# Patient Record
Sex: Male | Born: 1970 | Race: Black or African American | Hispanic: No | Marital: Married | Smoking: Current every day smoker
Health system: Southern US, Community
[De-identification: ages and names within clinical notes are randomized; demographics above are authoritative.]

## PROBLEM LIST (undated history)

## (undated) DIAGNOSIS — D649 Anemia, unspecified: Secondary | ICD-10-CM

## (undated) DIAGNOSIS — E7800 Pure hypercholesterolemia, unspecified: Secondary | ICD-10-CM

## (undated) HISTORY — DX: Anemia, unspecified: D64.9

## (undated) HISTORY — DX: Pure hypercholesterolemia, unspecified: E78.00

---

## 1997-10-29 ENCOUNTER — Emergency Department (HOSPITAL_COMMUNITY): Admission: EM | Admit: 1997-10-29 | Discharge: 1997-10-29 | Payer: Self-pay | Admitting: Emergency Medicine

## 1998-03-16 DIAGNOSIS — D649 Anemia, unspecified: Secondary | ICD-10-CM

## 1998-03-16 HISTORY — DX: Anemia, unspecified: D64.9

## 1998-09-09 ENCOUNTER — Emergency Department (HOSPITAL_COMMUNITY): Admission: EM | Admit: 1998-09-09 | Discharge: 1998-09-09 | Payer: Self-pay | Admitting: Emergency Medicine

## 2000-07-09 ENCOUNTER — Emergency Department (HOSPITAL_COMMUNITY): Admission: EM | Admit: 2000-07-09 | Discharge: 2000-07-09 | Payer: Self-pay | Admitting: *Deleted

## 2000-07-09 ENCOUNTER — Encounter: Payer: Self-pay | Admitting: Emergency Medicine

## 2002-01-17 ENCOUNTER — Emergency Department (HOSPITAL_COMMUNITY): Admission: EM | Admit: 2002-01-17 | Discharge: 2002-01-17 | Payer: Self-pay

## 2002-03-27 ENCOUNTER — Encounter: Admission: RE | Admit: 2002-03-27 | Discharge: 2002-03-27 | Payer: Self-pay | Admitting: Specialist

## 2002-03-27 ENCOUNTER — Encounter: Payer: Self-pay | Admitting: Specialist

## 2002-09-02 ENCOUNTER — Encounter: Payer: Self-pay | Admitting: Emergency Medicine

## 2002-09-02 ENCOUNTER — Emergency Department (HOSPITAL_COMMUNITY): Admission: EM | Admit: 2002-09-02 | Discharge: 2002-09-02 | Payer: Self-pay | Admitting: Emergency Medicine

## 2004-03-14 ENCOUNTER — Encounter: Admission: RE | Admit: 2004-03-14 | Discharge: 2004-03-14 | Payer: Self-pay | Admitting: Specialist

## 2006-01-13 IMAGING — CR DG CHEST 1V
1 series · 1 of 1 positions shown · non-contrast
Comparison: none

CLINICAL DATA: +PPD evaluation.
 PORTABLE CHEST RADIOGRAPH ? 03/14/04: 
 Comparing:    03/27/02.

[view not recorded]
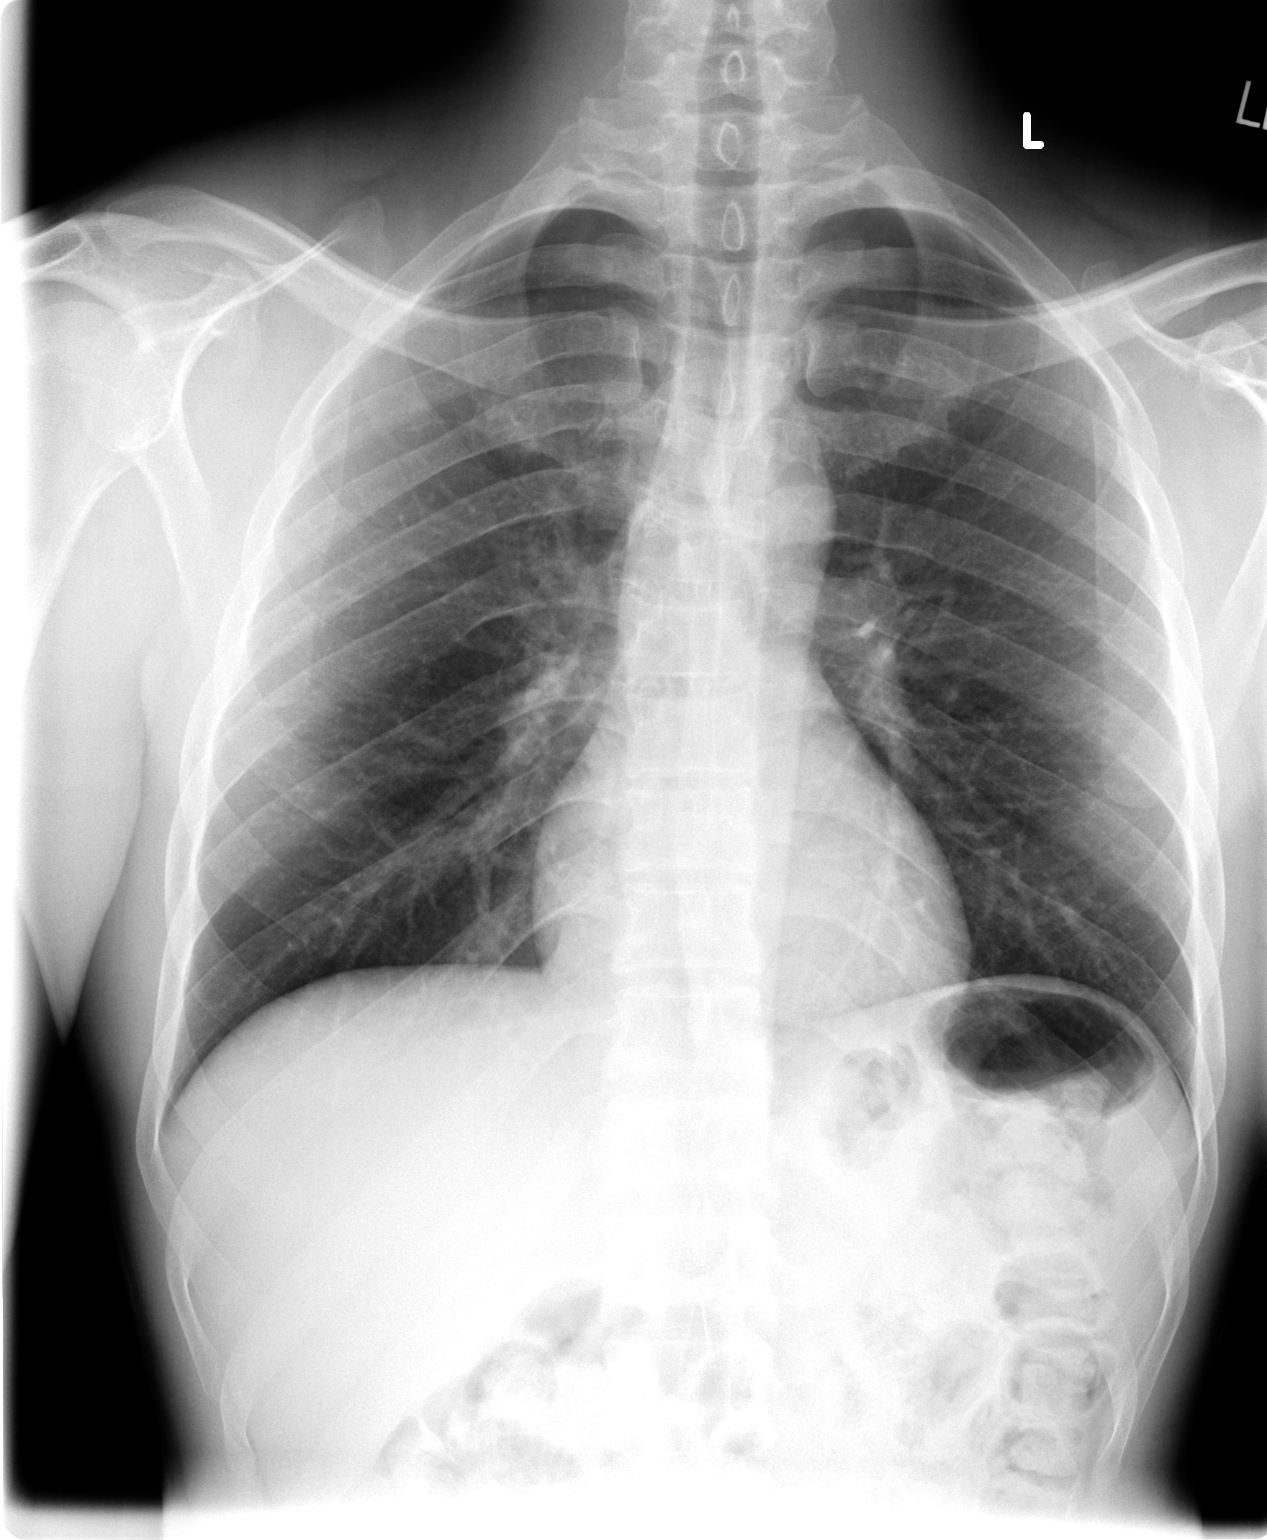

[1 of 1 positions shown; findings below may reference images not displayed]

FINDINGS: The heart size and mediastinal contours are normal.  The lungs are clear.  The visualized skeleton is unremarkable.
IMPRESSION: No active disease.   No evidence of active pulmonary tuberculosis.

## 2012-11-29 ENCOUNTER — Ambulatory Visit: Payer: Self-pay

## 2012-12-16 ENCOUNTER — Ambulatory Visit: Payer: Self-pay

## 2013-11-28 ENCOUNTER — Ambulatory Visit: Payer: Self-pay | Attending: Family Medicine | Admitting: Family Medicine

## 2013-11-28 ENCOUNTER — Encounter: Payer: Self-pay | Admitting: Family Medicine

## 2013-11-28 VITALS — BP 133/84 | HR 79 | Temp 98.3°F | Resp 18 | Ht 69.5 in | Wt 171.0 lb

## 2013-11-28 DIAGNOSIS — H6120 Impacted cerumen, unspecified ear: Secondary | ICD-10-CM | POA: Insufficient documentation

## 2013-11-28 DIAGNOSIS — Z833 Family history of diabetes mellitus: Secondary | ICD-10-CM | POA: Insufficient documentation

## 2013-11-28 DIAGNOSIS — H5200 Hypermetropia, unspecified eye: Secondary | ICD-10-CM

## 2013-11-28 DIAGNOSIS — H5213 Myopia, bilateral: Secondary | ICD-10-CM

## 2013-11-28 DIAGNOSIS — H6122 Impacted cerumen, left ear: Secondary | ICD-10-CM

## 2013-11-28 DIAGNOSIS — K089 Disorder of teeth and supporting structures, unspecified: Secondary | ICD-10-CM | POA: Insufficient documentation

## 2013-11-28 DIAGNOSIS — B353 Tinea pedis: Secondary | ICD-10-CM | POA: Insufficient documentation

## 2013-11-28 DIAGNOSIS — E7800 Pure hypercholesterolemia, unspecified: Secondary | ICD-10-CM | POA: Insufficient documentation

## 2013-11-28 DIAGNOSIS — H5203 Hypermetropia, bilateral: Secondary | ICD-10-CM

## 2013-11-28 DIAGNOSIS — F17200 Nicotine dependence, unspecified, uncomplicated: Secondary | ICD-10-CM | POA: Insufficient documentation

## 2013-11-28 DIAGNOSIS — D509 Iron deficiency anemia, unspecified: Secondary | ICD-10-CM | POA: Insufficient documentation

## 2013-11-28 DIAGNOSIS — H5210 Myopia, unspecified eye: Secondary | ICD-10-CM

## 2013-11-28 LAB — CBC
HEMATOCRIT: 39.8 % (ref 39.0–52.0)
HEMOGLOBIN: 14.3 g/dL (ref 13.0–17.0)
MCH: 28.3 pg (ref 26.0–34.0)
MCHC: 35.9 g/dL (ref 30.0–36.0)
MCV: 78.7 fL (ref 78.0–100.0)
Platelets: 248 10*3/uL (ref 150–400)
RBC: 5.06 MIL/uL (ref 4.22–5.81)
RDW: 13.5 % (ref 11.5–15.5)
WBC: 7.8 10*3/uL (ref 4.0–10.5)

## 2013-11-28 MED ORDER — KETOCONAZOLE 2 % EX CREA: 1.0000 "application " | TOPICAL_CREAM | Freq: Every day | CUTANEOUS | Status: DC

## 2013-11-28 NOTE — Progress Notes (Signed)
   Subjective:    Patient ID: Jerry Jensen, male    DOB: 05-17-1970, 43 y.o.   MRN: [id number] CC: establish care, health maintenance physical  HPI A 43 year old male presents to establish care and for a health maintenance physical:  #1 complaints/concerns: none   Social history: current smoker desires to quit  Review of Systems General:  Negative for nexplained weight loss, fever Skin: Negative for new or changing mole, sore that won't heal HEENT: positive for trouble seeing up close. Negative for trouble hearing,  ringing in ears, mouth sores, hoarseness, change in voice, dysphagia. CV:  Negative for chest pain, dyspnea, edema, palpitations Resp: Negative for cough, dyspnea, hemoptysis GI: Negative for nausea, vomiting, diarrhea, constipation, abdominal pain, melena, hematochezia. GU: Negative for dysuria, incontinence, urinary hesitance, hematuria, vaginal or penile discharge, polyuria, sexual difficulty, lumps in testicle or breasts MSK: Negative for muscle cramps or aches, joint pain or swelling Neuro: Negative for headaches, weakness, numbness, dizziness, passing out/fainting Psych: Negative for depression, anxiety, memory problems    Objective:   Physical Exam BP 133/84  Pulse 79  Temp(Src) 98.3 F (36.8 C) (Oral)  Resp 18  Ht 5' 9.5" (1.765 m)  Wt 171 lb (77.565 kg)  BMI 24.90 kg/m2  SpO2 98%  General Appearance:    Alert, cooperative, no distress, appears stated age  Head:    Normocephalic, without obvious abnormality, atraumatic  Eyes:    PERRL, conjunctiva/corneas clear, EOM's intact, fundi    benign, both eyes       Ears:    Normal TM's and external ear canals, both ears  Nose:   Nares normal, septum midline, mucosa normal, no drainage   or sinus tenderness  Throat:   Lips, mucosa, and tongue normal; teeth - poor dentition.  gums normal  Neck:   Supple, symmetrical, trachea midline, no adenopathy;       thyroid:  No enlargement/tenderness/nodules;   Back:      Symmetric, no curvature, ROM normal, no CVA tenderness  Lungs:     Clear to auscultation bilaterally, respirations unlabored  Chest wall:    No tenderness or deformity  Heart:    Regular rate and rhythm, S1 and S2 normal, no murmur, rub   or gallop  Abdomen:     Soft, non-tender, bowel sounds active all four quadrants,    no masses, no organomegaly  Genitalia:    Normal male, circumcised  without lesion, discharge or tenderness  Rectal:    Deferred   Extremities:   Extremities normal, atraumatic, no cyanosis or edema  Pulses:   2+ and symmetric all extremities  Skin:   Skin color, texture, turgor normal. White macerated skin between toes on L foot.   Lymph nodes:   Cervical, supraclavicular, and axillary nodes normal  Neurologic:   CNII-XII intact. Normal strength, sensation and reflexes      throughout       Assessment & Plan:

## 2013-11-28 NOTE — Assessment & Plan Note (Signed)
A: fam history of DM2 in a smoker, may be in statin benefit group P: CMP Lipid panel

## 2013-11-28 NOTE — Assessment & Plan Note (Signed)
Ear irrigated today.  

## 2013-11-28 NOTE — Assessment & Plan Note (Signed)
A: mild acute tinea pedis, L foot P: nizoral cream

## 2013-11-28 NOTE — Progress Notes (Signed)
Establish Care Annual Physicol   

## 2013-11-28 NOTE — Assessment & Plan Note (Signed)
A: history of IDA.  P: CBC, iron studies.

## 2013-11-28 NOTE — Patient Instructions (Addendum)
Jerry Jensen,  Thank you for coming in today. It was a pleasure meeting you. I look forward to be a primary doctor.  Your physical is normal except for the ear wax that was irrigated today.   You will be called with lab results.   Smoking cessation support: smoking cessation hotline: 1-800-QUIT-NOW.  Smoking cessation classes are available through [facility]. Call [phone] or visit our website at [email or web address]. I also recommend nicotine patches 21 mg for two weeks, 14 mg for 6 weeks, then 7 mg for 4 weeks. To help with smoking cessation.     Dr. Armen Pickup

## 2013-11-28 NOTE — Assessment & Plan Note (Signed)
A: current smoker, desires to quit P: Smoking cessation resources given Recommend nicotine patches

## 2013-11-28 NOTE — Assessment & Plan Note (Signed)
A: poor dentition in a smoker P: dental referral  Smoking cessation

## 2013-11-29 LAB — IRON AND TIBC
%SAT: 24 % (ref 20–55)
IRON: 81 ug/dL (ref 42–165)
TIBC: 334 ug/dL (ref 215–435)
UIBC: 253 ug/dL (ref 125–400)

## 2013-11-29 LAB — LIPID PANEL
CHOL/HDL RATIO: 5 ratio
Cholesterol: 225 mg/dL — ABNORMAL HIGH (ref 0–200)
HDL: 45 mg/dL (ref >39)
LDL Cholesterol: 125 mg/dL — ABNORMAL HIGH (ref 0–99)
TRIGLYCERIDES: 276 mg/dL — AB (ref <150)
VLDL: 55 mg/dL — ABNORMAL HIGH (ref 0–40)

## 2013-11-29 LAB — COMPLETE METABOLIC PANEL WITH GFR
ALK PHOS: 70 U/L (ref 39–117)
ALT: 29 U/L (ref 0–53)
AST: 22 U/L (ref 0–37)
Albumin: 4.6 g/dL (ref 3.5–5.2)
BUN: 8 mg/dL (ref 6–23)
CO2: 27 meq/L (ref 19–32)
CREATININE: 0.82 mg/dL (ref 0.50–1.35)
Calcium: 9.6 mg/dL (ref 8.4–10.5)
Chloride: 105 mEq/L (ref 96–112)
GFR, Est Non African American: >89 mL/min
Glucose, Bld: 91 mg/dL (ref 70–99)
Potassium: 4.3 mEq/L (ref 3.5–5.3)
Sodium: 138 mEq/L (ref 135–145)
Total Bilirubin: 0.4 mg/dL (ref 0.2–1.2)
Total Protein: 7.5 g/dL (ref 6.0–8.3)

## 2013-11-29 LAB — FERRITIN: Ferritin: 275 ng/mL (ref 22–322)

## 2013-12-01 DIAGNOSIS — E7800 Pure hypercholesterolemia, unspecified: Secondary | ICD-10-CM

## 2013-12-01 HISTORY — DX: Pure hypercholesterolemia, unspecified: E78.00

## 2013-12-01 MED ORDER — ATORVASTATIN CALCIUM 20 MG PO TABS: 20.0000 mg | ORAL_TABLET | Freq: Every day | ORAL | Status: DC

## 2013-12-01 NOTE — Assessment & Plan Note (Signed)
Elevated cholesterol- plan start lipitor 20 mg daily,  regular exercise, low fat, low sugar diet, quit smoking- all in order to lower risk of heart disease and stroke.  Will repeat fasting lipids in 6-12 months.

## 2013-12-01 NOTE — Addendum Note (Signed)
Addended by: Dessa Phi on: 12/01/2013 10:15 AM   Modules accepted: Orders

## 2013-12-04 ENCOUNTER — Telehealth: Payer: Self-pay | Admitting: *Deleted

## 2013-12-04 NOTE — Telephone Encounter (Signed)
Message copied by Dyann Kief on Mon Dec 04, 2013  2:14 PM ------      Message from: Dessa Phi      Created: Fri Dec 01, 2013 10:14 AM       normal CBC, iron studies, CMP      Elevated cholesterol- plan start lipitor 20 mg daily,  regular exercise, low fat, low sugar diet, quit smoking- all in order to lower risk of heart disease and stroke.       Will repeat fasting lipids in 6-12 months.        ------

## 2013-12-04 NOTE — Telephone Encounter (Signed)
Unable to cantact Pt, both number listed are wrong number

## 2014-01-15 ENCOUNTER — Telehealth: Payer: Self-pay | Admitting: Family Medicine

## 2014-01-15 NOTE — Telephone Encounter (Signed)
Patient is calling for most recent lab results; please f/u with patient

## 2014-01-16 ENCOUNTER — Telehealth: Payer: Self-pay | Admitting: Emergency Medicine

## 2014-01-16 NOTE — Telephone Encounter (Signed)
Left message for pt to call for lab results 

## 2014-01-24 ENCOUNTER — Telehealth: Payer: Self-pay | Admitting: Family Medicine

## 2014-01-24 NOTE — Telephone Encounter (Signed)
Pt. Came into facility requesting results. Please f/u with pt.

## 2014-01-26 ENCOUNTER — Telehealth: Payer: Self-pay | Admitting: Emergency Medicine

## 2014-01-26 NOTE — Telephone Encounter (Signed)
Pt given lab results with instructions to start taking prescribed Lipitor 20 mg tab daily followed with diet/exercise [facility] Pt instructed to work on smoking cessation with low fat diet control

## 2014-04-27 ENCOUNTER — Ambulatory Visit: Payer: Self-pay | Admitting: Family Medicine

## 2014-05-03 ENCOUNTER — Encounter: Payer: Self-pay | Admitting: Family Medicine

## 2014-05-03 ENCOUNTER — Ambulatory Visit: Payer: Self-pay | Attending: Family Medicine | Admitting: Family Medicine

## 2014-05-03 VITALS — BP 109/71 | HR 76 | Temp 98.0°F | Resp 16 | Ht 71.0 in | Wt 182.6 lb

## 2014-05-03 DIAGNOSIS — Z23 Encounter for immunization: Secondary | ICD-10-CM

## 2014-05-03 DIAGNOSIS — F17210 Nicotine dependence, cigarettes, uncomplicated: Secondary | ICD-10-CM | POA: Insufficient documentation

## 2014-05-03 DIAGNOSIS — K089 Disorder of teeth and supporting structures, unspecified: Secondary | ICD-10-CM

## 2014-05-03 DIAGNOSIS — Z720 Tobacco use: Secondary | ICD-10-CM

## 2014-05-03 DIAGNOSIS — E7800 Pure hypercholesterolemia, unspecified: Secondary | ICD-10-CM

## 2014-05-03 DIAGNOSIS — K088 Other specified disorders of teeth and supporting structures: Secondary | ICD-10-CM

## 2014-05-03 DIAGNOSIS — N529 Male erectile dysfunction, unspecified: Secondary | ICD-10-CM

## 2014-05-03 DIAGNOSIS — F17200 Nicotine dependence, unspecified, uncomplicated: Secondary | ICD-10-CM

## 2014-05-03 LAB — LIPID PANEL
CHOL/HDL RATIO: 4.1 ratio
CHOLESTEROL: 176 mg/dL (ref 0–200)
HDL: 43 mg/dL (ref >39)
LDL CALC: 95 mg/dL (ref 0–99)
TRIGLYCERIDES: 191 mg/dL — AB (ref <150)
VLDL: 38 mg/dL (ref 0–40)

## 2014-05-03 MED ORDER — VARENICLINE TARTRATE 0.5 MG X 11 & 1 MG X 42 PO MISC: ORAL | Status: DC

## 2014-05-03 MED ORDER — SILDENAFIL CITRATE 100 MG PO TABS: 50.0000 mg | ORAL_TABLET | Freq: Every day | ORAL | Status: DC | PRN

## 2014-05-03 MED ORDER — VARENICLINE TARTRATE 1 MG PO TABS: 1.0000 mg | ORAL_TABLET | Freq: Two times a day (BID) | ORAL | Status: DC

## 2014-05-03 NOTE — Progress Notes (Signed)
   Subjective:    Patient ID: Jerry MeekSouleymane Fier, male    DOB: 07/26/1970, 44 y.o.   MRN: [id number] CC: recheck cholesterol, ED, dental referral.  HPI 44 yo M with hyperlipidemia  1. Erectile Dysfunction Patient complains of erectile dysfunction. Onset of dysfunction was 5 months ago and was gradual in onset.  Patient states the nature of difficulty is both attaining and maintaining erection. Full erections occur with intercourse and with masturbation. Partial erections occur never. Libido is not affected. Risk factors for ED include: none. Patient denies history of cardiovascular disease, diabetes mellitus, cranial, spinal, or pelvic trauma, pelvic radiation, beta blocker use and hypogonadism. Patient's expectations as to sexual function to get a spontaneous erection.  Patient's description of relationship with partner good with wife but she has expressed some frustration with the amount of time and energy required to obtain erection. Previous treatment of ED includes cialis x one dose 10 years ago, he had headache.  2. Smoker: taking lozenges: has cut down smoking quite a bit.   3. dental referral: needs a cleaning. Intermittent R lower molar pain w/o swelling.   Soc Hx: < 7-8 cigs per day  No ETOH.  Review of Systems     Objective:   Physical Exam BP 109/71 mmHg  Pulse 76  Temp(Src) 98 F (36.7 C)  Resp 16  Ht 5\' 11"  (1.803 m)  Wt 182 lb 9.6 oz (82.827 kg)  BMI 25.48 kg/m2  SpO2 97% General appearance: alert, cooperative and no distress Lungs: clear to auscultation bilaterally Heart: regular rate and rhythm, S1, S2 normal, no murmur, click, rub or gallop       Assessment & Plan:

## 2014-05-03 NOTE — Assessment & Plan Note (Signed)
Dental referral placed unsure if patient has orange card

## 2014-05-03 NOTE — Progress Notes (Signed)
Patient here to follow up on his cholesterol  Patient is complaining of erectile dysfunction for last 4 -5 months Patient will take flu shot

## 2014-05-03 NOTE — Assessment & Plan Note (Signed)
1. High cholesterol:  Rechecking lipid panel today

## 2014-05-03 NOTE — Assessment & Plan Note (Signed)
Erectile dysfunction: Start viagra Quit smoking

## 2014-05-03 NOTE — Patient Instructions (Signed)
Mr. Pernell DupreDjibo,  1. High cholesterol:  Rechecking lipid panel today  2. Erectile dysfunction: Start viagra Quit smoking   3. Smoking: Smoking cessation support: smoking cessation hotline: 1-800-QUIT-NOW.  Smoking cessation classes are available through [facility]. Call [phone] or visit our website at [email or web address]. Chantix: Take one 0.5 mg tablet by mouth once daily for 3 days, then increase to one 0.5 mg tablet twice daily for 4 days, then increase to one 1 mg tablet twice daily.  4. Dental referral [placed  You will be called with lab results  F/u in 3 months for smoking and erectile dysfunction sooner if needed  Dr. Armen PickupFunches

## 2014-05-03 NOTE — Assessment & Plan Note (Signed)
Smoking: desires to quit  Smoking cessation support: smoking cessation hotline: 1-800-QUIT-NOW.  Smoking cessation classes are available through [facility]. Call [phone] or visit our website at [email or web address]. Chantix: Take one 0.5 mg tablet by mouth once daily for 3 days, then increase to one 0.5 mg tablet twice daily for 4 days, then increase to one 1 mg tablet twice daily.

## 2014-05-06 NOTE — Addendum Note (Signed)
Addended by: Dessa PhiFUNCHES, Alexx Giambra on: 05/06/2014 09:52 AM   Modules accepted: Orders

## 2014-05-09 ENCOUNTER — Telehealth: Payer: Self-pay | Admitting: *Deleted

## 2014-05-09 NOTE — Telephone Encounter (Signed)
Pt aware of results Stated trying to stop smocking

## 2014-05-09 NOTE — Telephone Encounter (Signed)
-----   Message from Lora PaulaJosalyn C Funches, MD sent at 05/06/2014  9:51 AM EST ----- Improved lipid panel Patient may stop statin therapy. Work on quitting smoking to lower risk of heart disease in stroke  Aside: 10 yr CVD risk 4.0%

## 2014-11-16 ENCOUNTER — Ambulatory Visit: Payer: Self-pay | Admitting: Family Medicine

## 2014-11-23 ENCOUNTER — Emergency Department (INDEPENDENT_AMBULATORY_CARE_PROVIDER_SITE_OTHER)
Admission: EM | Admit: 2014-11-23 | Discharge: 2014-11-23 | Disposition: A | Discharge status: Home / Self Care | Payer: No Typology Code available for payment source | Source: Home / Self Care | Attending: Emergency Medicine | Admitting: Emergency Medicine

## 2014-11-23 ENCOUNTER — Encounter (HOSPITAL_COMMUNITY): Payer: Self-pay | Admitting: *Deleted

## 2014-11-23 DIAGNOSIS — L02416 Cutaneous abscess of left lower limb: Secondary | ICD-10-CM

## 2014-11-23 MED ORDER — MUPIROCIN 2 % EX OINT: 1.0000 "application " | TOPICAL_OINTMENT | Freq: Three times a day (TID) | CUTANEOUS | Status: DC

## 2014-11-23 NOTE — Discharge Instructions (Signed)
Abscess Care After. Your abscess appears to be resolving. Use warm moist compresses at least 4 times a day.  This is contagious. Wash hands and clean area of leg with alcohol. An abscess (also called a boil or furuncle) is an infected area that contains a collection of pus. Signs and symptoms of an abscess include pain, tenderness, redness, or hardness, or you may feel a moveable soft area under your skin. An abscess can occur anywhere in the body. The infection may spread to surrounding tissues causing cellulitis. A cut (incision) by the surgeon was made over your abscess and the pus was drained out. Gauze may have been packed into the space to provide a drain that will allow the cavity to heal from the inside outwards. The boil may be painful for 5 to 7 days. Most people with a boil do not have high fevers. Your abscess, if seen early, may not have localized, and may not have been lanced. If not, another appointment may be required for this if it does not get better on its own or with medications. HOME CARE INSTRUCTIONS   Only take over-the-counter or prescription medicines for pain, discomfort, or fever as directed by your caregiver.  When you bathe, soak and then remove gauze or iodoform packs at least daily or as directed by your caregiver. You may then wash the wound gently with mild soapy water. Repack with gauze or do as your caregiver directs. SEEK IMMEDIATE MEDICAL CARE IF:   You develop increased pain, swelling, redness, drainage, or bleeding in the wound site.  You develop signs of generalized infection including muscle aches, chills, fever, or a general ill feeling.  An oral temperature above 102 F (38.9 C) develops, not controlled by medication. See your caregiver for a recheck if you develop any of the symptoms described above. If medications (antibiotics) were prescribed, take them as directed. Document Released: 09/18/2004 Document Revised: 05/25/2011 Document Reviewed:  05/16/2007 [facility] Patient Information 2015 [place], [place]. This information is not intended to replace advice given to you by your health care provider. Make sure you discuss any questions you have with your health care provider.

## 2014-11-23 NOTE — ED Notes (Signed)
Pt  Reports   He   Noticed      A         Boil  On  His  l  Inner  Thigh        For 2-3  Weeks        He  Reports  The  Boil  Started  Draining        And     Today  It  Is    Tender to the  Touch

## 2014-11-23 NOTE — ED Provider Notes (Signed)
CSN: [id number]     Arrival date & time 11/23/14  1329 History   First MD Initiated Contact with Patient 11/23/14 1404     Chief Complaint  Patient presents with  . Abscess   (Consider location/radiation/quality/duration/timing/severity/associated sxs/prior Treatment) HPI Comments: 44 year old male patient reports to the provider that he has had a tender abscess-like structure to the left proximal inner thigh for placement one week. Today it ruptured and has quickly become smaller. There is tenderness and induration in an area approximately 2-1/2 cm diameter. This area also has an overlying area in which the dermis appears to have been a braised but was wants a fistula for spontaneous drainage. No surrounding erythema. No lymphangitis.   Past Medical History  Diagnosis Date  . Anemia 2000   No past surgical history on file. Family History  Problem Relation Age of Onset  . Cancer Mother     breast   . Diabetes Sister   . Hypertension Sister   . Anemia Sister   . Heart disease Neg Hx    Social History  Substance Use Topics  . Smoking status: Current Every Day Smoker -- 0.50 packs/day for 30 years    Types: Cigarettes  . Smokeless tobacco: Never Used  . Alcohol Use: No    Review of Systems  Constitutional: Negative.   Respiratory: Negative.   Musculoskeletal: Negative.   Skin: Negative for rash.       As per history of present illness  Neurological: Negative.   All other systems reviewed and are negative.   Allergies  Review of patient's allergies indicates no known allergies.  Home Medications   Prior to Admission medications   Medication Sig Start Date End Date Taking? Authorizing Provider  ferrous sulfate 325 (65 FE) MG tablet Take 325 mg by mouth 2 (two) times daily with a meal.    Historical Provider, MD  ketoconazole (NIZORAL) 2 % cream Apply 1 application topically daily. 11/28/13   Dessa Phi, MD  mupirocin ointment (BACTROBAN) 2 % Apply 1 application  topically 3 (three) times daily. Apply after warm soak for 10 minutes 11/23/14   Hayden Rasmussen, NP  sildenafil (VIAGRA) 100 MG tablet Take 0.5-1 tablets (50-100 mg total) by mouth daily as needed for erectile dysfunction. 05/03/14   Dessa Phi, MD  varenicline (CHANTIX CONTINUING MONTH PAK) 1 MG tablet Take 1 tablet (1 mg total) by mouth 2 (two) times daily. 05/03/14   Josalyn Funches, MD  varenicline (CHANTIX STARTING MONTH PAK) 0.5 MG X 11 & 1 MG X 42 tablet Take as instructed 05/03/14   Dessa Phi, MD   Meds Ordered and Administered this Visit  Medications - No data to display  BP 141/90 mmHg  Pulse 78  Temp(Src) 98.6 F (37 C) (Oral)  Resp 16  SpO2 96% No data found.   Physical Exam  Constitutional: He is oriented to person, place, and time. He appears well-developed and well-nourished. No distress.  Neck: Normal range of motion. Neck supple.  Pulmonary/Chest: Effort normal. No respiratory distress.  Musculoskeletal: Normal range of motion. He exhibits no edema.  Neurological: He is alert and oriented to person, place, and time. He exhibits normal muscle tone.  Skin: Skin is warm and dry.  See history of present illness. No current drainage. Area of superficial induration and skin change appearance similar to an abrasion approximately 2-1/2 cm. No erythema or lymphangitis.  Psychiatric: He has a normal mood and affect.  Nursing note and vitals reviewed.   ED  Course  Procedures (including critical care time)  Labs Review Labs Reviewed - No data to display  Imaging Review No results found.   Visual Acuity Review  Right Eye Distance:   Left Eye Distance:   Bilateral Distance:    Right Eye Near:   Left Eye Near:    Bilateral Near:         MDM   1. Abscess of left thigh   Resolving. No longer amenable to I and D at this time. Mupirocin oint as directed. Care After. Your abscess appears to be resolving. Use warm moist compresses at least 4 times a day.  This  is contagious. Wash hands and clean area of leg with alcohol.     Hayden Rasmussen, NP 11/23/14 [phone]

## 2014-12-06 ENCOUNTER — Encounter: Payer: Self-pay | Admitting: Family Medicine

## 2014-12-06 ENCOUNTER — Ambulatory Visit: Payer: No Typology Code available for payment source | Attending: Family Medicine | Admitting: Family Medicine

## 2014-12-06 VITALS — BP 132/87 | HR 73 | Temp 97.7°F | Resp 16 | Ht 71.0 in | Wt 171.0 lb

## 2014-12-06 DIAGNOSIS — Z0000 Encounter for general adult medical examination without abnormal findings: Secondary | ICD-10-CM

## 2014-12-06 DIAGNOSIS — Z23 Encounter for immunization: Secondary | ICD-10-CM

## 2014-12-06 DIAGNOSIS — J00 Acute nasopharyngitis [common cold]: Secondary | ICD-10-CM

## 2014-12-06 DIAGNOSIS — N529 Male erectile dysfunction, unspecified: Secondary | ICD-10-CM

## 2014-12-06 DIAGNOSIS — F17200 Nicotine dependence, unspecified, uncomplicated: Secondary | ICD-10-CM

## 2014-12-06 DIAGNOSIS — Z720 Tobacco use: Secondary | ICD-10-CM

## 2014-12-06 MED ORDER — CETIRIZINE HCL 10 MG PO TABS: 10.0000 mg | ORAL_TABLET | Freq: Every day | ORAL | Status: DC

## 2014-12-06 MED ORDER — SALINE SPRAY 0.65 % NA SOLN: 1.0000 | NASAL | Status: DC | PRN

## 2014-12-06 MED ORDER — FLUTICASONE PROPIONATE 50 MCG/ACT NA SUSP: 2.0000 | Freq: Every day | NASAL | Status: DC

## 2014-12-06 MED ORDER — SILDENAFIL CITRATE 100 MG PO TABS: 50.0000 mg | ORAL_TABLET | Freq: Every day | ORAL | Status: DC | PRN

## 2014-12-06 MED ORDER — VARENICLINE TARTRATE 0.5 MG X 11 & 1 MG X 42 PO MISC: ORAL | Status: DC

## 2014-12-06 MED ORDER — VARENICLINE TARTRATE 1 MG PO TABS: 1.0000 mg | ORAL_TABLET | Freq: Two times a day (BID) | ORAL | Status: DC

## 2014-12-06 NOTE — Assessment & Plan Note (Signed)
A:  Smoker. Desires to quit.  P: Start  chantix is too expensive get nicotine patch. Remember that if you develop depression or thoughts of suicide while on chantix, stop it and call for follow up.   21 mg patch for 6 weeks 14 mg patch for 2 weeks 7 mg patch for 2 weeks

## 2014-12-06 NOTE — Patient Instructions (Signed)
Mr. Jerry Jensen,  Thank you for coming in today  Jerry Jensen was seen today for sinusitis and mass.  Diagnoses and all orders for this visit:  Acute rhinitis -     fluticasone (FLONASE) 50 MCG/ACT nasal spray; Place 2 sprays into both nostrils daily. -     sodium chloride (OCEAN) 0.65 % SOLN nasal spray; Place 1 spray into both nostrils as needed for congestion. -     cetirizine (ZYRTEC) 10 MG tablet; Take 1 tablet (10 mg total) by mouth daily.  Erectile dysfunction, unspecified erectile dysfunction type -     sildenafil (VIAGRA) 100 MG tablet; Take 0.5-1 tablets (50-100 mg total) by mouth daily as needed for erectile dysfunction.  Smoker -     varenicline (CHANTIX CONTINUING MONTH PAK) 1 MG tablet; Take 1 tablet (1 mg total) by mouth 2 (two) times daily. -     varenicline (CHANTIX STARTING MONTH PAK) 0.5 MG X 11 & 1 MG X 42 tablet; Take as instructed  if chantix is too expensive get nicotine patch. Remember that if you develop depression or thoughts of suicide while on chantix, stop it and call for follow up.   21 mg patch for 6 weeks 14 mg patch for 2 weeks 7 mg patch for 2 weeks  F/u with me in 2 months for smoking cessation  Dr. Armen Pickup   Dr. Armen Pickup

## 2014-12-06 NOTE — Progress Notes (Signed)
Patient ID: Jerry Jensen, male   DOB: December 29, 1970, 44 y.o.   MRN: [id number]   Subjective:  Patient ID: Jerry Jensen, male    DOB: Jun 15, 1970  Age: 44 y.o. MRN: [id number]  CC: Sinusitis and Mass  HPI Jerry Jensen presents for bump on L posterior thigh, sinus pressure  1. L thigh bump: has drained. Was tender. Improving. No redness. No continual drainage.   2. Sinus pressure: x one night. No history of allergies. No fever or chills.   3. Smoking: desires to quit. Has not obtained chantix. Not in pharmacy. Smoking 1 PPD. Has withdrawal symptoms when he tried to cut down on his own.   Outpatient Prescriptions Prior to Visit  Medication Sig Dispense Refill  . ferrous sulfate 325 (65 FE) MG tablet Take 325 mg by mouth 2 (two) times daily with a meal.    . ketoconazole (NIZORAL) 2 % cream Apply 1 application topically daily. (Patient not taking: Reported on 12/06/2014) 15 g 0  . mupirocin ointment (BACTROBAN) 2 % Apply 1 application topically 3 (three) times daily. Apply after warm soak for 10 minutes (Patient not taking: Reported on 12/06/2014) 22 g 0  . sildenafil (VIAGRA) 100 MG tablet Take 0.5-1 tablets (50-100 mg total) by mouth daily as needed for erectile dysfunction. (Patient not taking: Reported on 12/06/2014) 5 tablet 3  . varenicline (CHANTIX CONTINUING MONTH PAK) 1 MG tablet Take 1 tablet (1 mg total) by mouth 2 (two) times daily. (Patient not taking: Reported on 12/06/2014) 60 tablet 1  . varenicline (CHANTIX STARTING MONTH PAK) 0.5 MG X 11 & 1 MG X 42 tablet Take as instructed (Patient not taking: Reported on 12/06/2014) 53 tablet 0   No facility-administered medications prior to visit.   Social History  Substance Use Topics  . Smoking status: Current Every Day Smoker -- 0.50 packs/day for 30 years    Types: Cigarettes  . Smokeless tobacco: Never Used  . Alcohol Use: No   ROS Review of Systems  Constitutional: Negative for fever, chills, fatigue and unexpected weight  change.  HENT: Positive for congestion and sinus pressure. Negative for ear discharge, ear pain and facial swelling.   Eyes: Negative for visual disturbance.  Respiratory: Negative for cough and shortness of breath.   Cardiovascular: Negative for chest pain, palpitations and leg swelling.  Gastrointestinal: Negative for nausea, vomiting, abdominal pain, diarrhea, constipation and blood in stool.  Endocrine: Negative for polydipsia, polyphagia and polyuria.  Musculoskeletal: Negative for myalgias, back pain, arthralgias, gait problem and neck pain.  Skin: Negative for rash.  Allergic/Immunologic: Negative for immunocompromised state.  Hematological: Negative for adenopathy. Does not bruise/bleed easily.  Psychiatric/Behavioral: Negative for suicidal ideas, sleep disturbance and dysphoric mood. The patient is not nervous/anxious.     Objective:  BP 132/87 mmHg  Pulse 73  Temp(Src) 97.7 F (36.5 C) (Other (Comment))  Resp 16  Ht  (1.803 m)  Wt 171 lb (77.565 kg)  BMI 23.86 kg/m2  SpO2 98%  BP/Weight 12/06/2014 11/23/2014 05/03/2014  Systolic BP 132 141 109  Diastolic BP 87 90 71  Wt. (Lbs) 171 - 182.6  BMI 23.86 - 25.48   Physical Exam  Constitutional: He appears well-developed and well-nourished. No distress.  HENT:  Head: Normocephalic and atraumatic.  Nose: Mucosal edema present.  Mouth/Throat: Oropharynx is clear and moist.  Eyes: Conjunctivae are normal. Pupils are equal, round, and reactive to light. Right eye exhibits no discharge.  Neck: Normal range of motion. Neck supple.  Cardiovascular: Normal  rate, regular rhythm, normal heart sounds and intact distal pulses.   Pulmonary/Chest: Effort normal and breath sounds normal.  Musculoskeletal: He exhibits no edema.  Neurological: He is alert.  Skin: Skin is warm and dry. No rash noted. No erythema.  Psychiatric: He has a normal mood and affect.  GAD-7: score of 0    Assessment & Plan:   Problem List Items  Addressed This Visit    Acute rhinitis - Primary   Relevant Medications   fluticasone (FLONASE) 50 MCG/ACT nasal spray   sodium chloride (OCEAN) 0.65 % SOLN nasal spray   cetirizine (ZYRTEC) 10 MG tablet   Erectile dysfunction (Chronic)   Relevant Medications   sildenafil (VIAGRA) 100 MG tablet   Smoker (Chronic)    A:  Smoker. Desires to quit.  P: Start  chantix is too expensive get nicotine patch. Remember that if you develop depression or thoughts of suicide while on chantix, stop it and call for follow up.   21 mg patch for 6 weeks 14 mg patch for 2 weeks 7 mg patch for 2 weeks       Relevant Medications   varenicline (CHANTIX CONTINUING MONTH PAK) 1 MG tablet   varenicline (CHANTIX STARTING MONTH PAK) 0.5 MG X 11 & 1 MG X 42 tablet    Other Visit Diagnoses    Healthcare maintenance        Relevant Orders    Flu Vaccine QUAD 36+ mos IM       No orders of the defined types were placed in this encounter.    Follow-up: No Follow-up on file.   Dessa Phi MD

## 2014-12-06 NOTE — Progress Notes (Signed)
C/C bump on leg and bottom  ED F/U skin infection  Possible sinuses infection  No pain Hx tobacco -1ppday

## 2016-02-19 ENCOUNTER — Ambulatory Visit: Payer: Self-pay | Attending: Family Medicine

## 2016-05-01 ENCOUNTER — Encounter: Payer: Self-pay | Admitting: Family Medicine

## 2016-05-15 ENCOUNTER — Encounter: Payer: Self-pay | Admitting: Family Medicine

## 2016-05-15 ENCOUNTER — Ambulatory Visit: Payer: Self-pay | Attending: Family Medicine | Admitting: Family Medicine

## 2016-05-15 VITALS — BP 138/87 | HR 84 | Temp 98.2°F | Ht 71.0 in | Wt 169.2 lb

## 2016-05-15 DIAGNOSIS — F17210 Nicotine dependence, cigarettes, uncomplicated: Secondary | ICD-10-CM | POA: Insufficient documentation

## 2016-05-15 DIAGNOSIS — E7800 Pure hypercholesterolemia, unspecified: Secondary | ICD-10-CM | POA: Insufficient documentation

## 2016-05-15 DIAGNOSIS — Z0000 Encounter for general adult medical examination without abnormal findings: Secondary | ICD-10-CM | POA: Insufficient documentation

## 2016-05-15 DIAGNOSIS — N529 Male erectile dysfunction, unspecified: Secondary | ICD-10-CM | POA: Insufficient documentation

## 2016-05-15 DIAGNOSIS — D509 Iron deficiency anemia, unspecified: Secondary | ICD-10-CM | POA: Insufficient documentation

## 2016-05-15 MED ORDER — SILDENAFIL CITRATE 25 MG PO TABS: 25.0000 mg | ORAL_TABLET | Freq: Every day | ORAL | 0 refills | Status: DC | PRN

## 2016-05-15 MED FILL — !VIAGRA 25 MG TABLET: 25 | 3 days supply | Qty: 3 | Fill #0

## 2016-05-15 NOTE — Patient Instructions (Addendum)
Diagnoses and all orders for this visit:  Iron deficiency anemia, unspecified iron deficiency anemia type -     Cancel: CBC -     Cancel: Iron and TIBC -     Cancel: Ferritin -     CBC; Future -     Ferritin; Future -     Iron and TIBC; Future  Elevated cholesterol -     Cancel: Lipid Panel -     Cancel: COMPLETE METABOLIC PANEL WITH GFR -     COMPLETE METABOLIC PANEL WITH GFR; Future -     Lipid Panel; Future  Erectile dysfunction, unspecified erectile dysfunction type -     sildenafil (VIAGRA) 25 MG tablet; Take 1-4 tablets (25-100 mg total) by mouth daily as needed for erectile dysfunction.   Please return next week for labs, fasting, morning lab appointment I have ordered viagra, there are 3 pills available as a sample at the pharmacy that you can get.  The patient assistance application will be started  F/u in 1 year for wellness physical   Dr. Armen Pickup   Sildenafil tablets (Viagra) What is this medicine? SILDENAFIL (sil DEN a fil) is used to treat erection problems in men. This medicine may be used for other purposes; ask your health care provider or pharmacist if you have questions. COMMON BRAND NAME(S): Viagra What should I tell my health care provider before I take this medicine? They need to know if you have any of these conditions: -bleeding disorders -eye or vision problems, including a rare inherited eye disease called retinitis pigmentosa -anatomical deformation of the penis, Peyronie's disease, or history of priapism (painful and prolonged erection) -heart disease, angina, a history of heart attack, irregular heart beats, or other heart problems -high or low blood pressure -history of blood diseases, like sickle cell anemia or leukemia -history of stomach bleeding -kidney disease -liver disease -stroke -an unusual or allergic reaction to sildenafil, other medicines, foods, dyes, or preservatives -pregnant or trying to get pregnant -breast-feeding How  should I use this medicine? Take this medicine by mouth with a glass of water. Follow the directions on the prescription label. The dose is usually taken 1 hour before sexual activity. You should not take the dose more than once per day. Do not take your medicine more often than directed. Talk to your pediatrician regarding the use of this medicine in children. This medicine is not used in children for this condition. Overdosage: If you think you have taken too much of this medicine contact a poison control center or emergency room at once. NOTE: This medicine is only for you. Do not share this medicine with others. What if I miss a dose? This does not apply. Do not take double or extra doses. What may interact with this medicine? Do not take this medicine with any of the following medications: -cisapride -nitrates like amyl nitrite, isosorbide dinitrate, isosorbide mononitrate, nitroglycerin -riociguat This medicine may also interact with the following medications: -antiviral medicines for HIV or AIDS -bosentan -certain medicines for benign prostatic hyperplasia (BPH) -certain medicines for blood pressure -certain medicines for fungal infections like ketoconazole and itraconazole -cimetidine -erythromycin -rifampin This list may not describe all possible interactions. Give your health care provider a list of all the medicines, herbs, non-prescription drugs, or dietary supplements you use. Also tell them if you smoke, drink alcohol, or use illegal drugs. Some items may interact with your medicine. What should I watch for while using this medicine? If you notice any changes  in your vision while taking this drug, call your doctor or health care professional as soon as possible. Stop using this medicine and call your health care provider right away if you have a loss of sight in one or both eyes. Contact your doctor or health care professional right away if you have an erection that lasts longer  than 4 hours or if it becomes painful. This may be a sign of a serious problem and must be treated right away to prevent permanent damage. If you experience symptoms of nausea, dizziness, chest pain or arm pain upon initiation of sexual activity after taking this medicine, you should refrain from further activity and call your doctor or health care professional as soon as possible. Do not drink alcohol to excess (examples, 5 glasses of wine or 5 shots of whiskey) when taking this medicine. When taken in excess, alcohol can increase your chances of getting a headache or getting dizzy, increasing your heart rate or lowering your blood pressure. Using this medicine does not protect you or your partner against HIV infection (the virus that causes AIDS) or other sexually transmitted diseases. What side effects may I notice from receiving this medicine? Side effects that you should report to your doctor or health care professional as soon as possible: -allergic reactions like skin rash, itching or hives, swelling of the face, lips, or tongue -breathing problems -changes in hearing -changes in vision -chest pain -fast, irregular heartbeat -prolonged or painful erection -seizures Side effects that usually do not require medical attention (report to your doctor or health care professional if they continue or are bothersome): -back pain -dizziness -flushing -headache -indigestion -muscle aches -nausea -stuffy or runny nose This list may not describe all possible side effects. Call your doctor for medical advice about side effects. You may report side effects to FDA at 1-800-FDA-1088. Where should I keep my medicine? Keep out of reach of children. Store at room temperature between 15 and 30 degrees C (59 and 86 degrees F). Throw away any unused medicine after the expiration date. NOTE: This sheet is a summary. It may not cover all possible information. If you have questions about this medicine, talk  to your doctor, pharmacist, or health care provider.  2018 Elsevier/Gold Standard (2015-02-13 12:00:25)

## 2016-05-15 NOTE — Progress Notes (Signed)
Pt is here today for a physical. 

## 2016-05-15 NOTE — Progress Notes (Signed)
SUBJECTIVE:  Jerry Jensen is a 46 y.o. male presenting for his annual checkup. He has no complaints.   Social History  Substance Use Topics  . Smoking status: Current Every Day Smoker    Packs/day: 0.50    Years: 30.00    Types: Cigarettes  . Smokeless tobacco: Never Used  . Alcohol use No   Current Outpatient Prescriptions  Medication Sig Dispense Refill  . ferrous sulfate 325 (65 FE) MG tablet Take 325 mg by mouth 2 (two) times daily with a meal.    . cetirizine (ZYRTEC) 10 MG tablet Take 1 tablet (10 mg total) by mouth daily. (Patient not taking: Reported on 05/15/2016) 30 tablet 11  . fluticasone (FLONASE) 50 MCG/ACT nasal spray Place 2 sprays into both nostrils daily. (Patient not taking: Reported on 05/15/2016) 16 g 6  . sildenafil (VIAGRA) 100 MG tablet Take 0.5-1 tablets (50-100 mg total) by mouth daily as needed for erectile dysfunction. (Patient not taking: Reported on 05/15/2016) 5 tablet 3  . sodium chloride (OCEAN) 0.65 % SOLN nasal spray Place 1 spray into both nostrils as needed for congestion. (Patient not taking: Reported on 05/15/2016) 15 mL 0  . varenicline (CHANTIX CONTINUING MONTH PAK) 1 MG tablet Take 1 tablet (1 mg total) by mouth 2 (two) times daily. (Patient not taking: Reported on 05/15/2016) 60 tablet 1  . varenicline (CHANTIX STARTING MONTH PAK) 0.5 MG X 11 & 1 MG X 42 tablet Take as instructed (Patient not taking: Reported on 05/15/2016) 53 tablet 0   No current facility-administered medications for this visit.    Allergies: Patient has no known allergies.   ROS:  Fatigue. Feeling well. No dyspnea or chest pain on exertion. No abdominal pain, change in bowel habits, black or bloody stools. Erectile dysfunction, was unable to afford Viagra.  No urinary tract or prostatic symptoms. No neurological complaints.  OBJECTIVE:  The patient appears well, alert, oriented x 3, in no distress.  BP 138/87 (BP Location: Left Arm, Patient Position: Sitting, Cuff Size: Small)    Pulse 84   Temp 98.2 F (36.8 C) (Oral)   Ht 5\' 11"  (1.803 m)   Wt 169 lb 3.2 oz (76.7 kg)   SpO2 97%   BMI 23.60 kg/m  ENT normal.  Neck supple. No adenopathy or thyromegaly. PERLA. Lungs are clear, good air entry, no wheezes, rhonchi or rales. S1 and S2 normal, no murmurs, regular rate and rhythm. Abdomen is soft without tenderness, guarding, mass or organomegaly. GU exam: no penile lesions or discharge, no testicular masses or tenderness, no hernias, RECTAL EXAM: deferred, not clinically indicated.  Extremities show no edema, normal peripheral pulses. Neurological is normal without focal findings.  ASSESSMENT:  healthy adult male  PLAN:  begin progressive daily aerobic exercise program Diagnoses and all orders for this visit:  Iron deficiency anemia, unspecified iron deficiency anemia type -     Cancel: CBC -     Cancel: Iron and TIBC -     Cancel: Ferritin -     CBC; Future -     Ferritin; Future -     Iron and TIBC; Future  Elevated cholesterol -     Cancel: Lipid Panel -     Cancel: COMPLETE METABOLIC PANEL WITH GFR -     COMPLETE METABOLIC PANEL WITH GFR; Future -     Lipid Panel; Future  Erectile dysfunction, unspecified erectile dysfunction type -     sildenafil (VIAGRA) 25 MG tablet; Take 1-4 tablets (25-100  mg total) by mouth daily as needed for erectile dysfunction.

## 2016-05-19 ENCOUNTER — Ambulatory Visit: Payer: Self-pay | Attending: Family Medicine

## 2016-05-19 ENCOUNTER — Ambulatory Visit: Payer: Self-pay

## 2016-05-19 DIAGNOSIS — E7800 Pure hypercholesterolemia, unspecified: Secondary | ICD-10-CM

## 2016-05-19 DIAGNOSIS — D509 Iron deficiency anemia, unspecified: Secondary | ICD-10-CM | POA: Insufficient documentation

## 2016-05-19 LAB — CBC
HEMATOCRIT: 40.1 % (ref 38.5–50.0)
HEMOGLOBIN: 13.7 g/dL (ref 13.2–17.1)
MCH: 27.9 pg (ref 27.0–33.0)
MCHC: 34.2 g/dL (ref 32.0–36.0)
MCV: 81.7 fL (ref 80.0–100.0)
MPV: 9 fL (ref 7.5–12.5)
Platelets: 219 10*3/uL (ref 140–400)
RBC: 4.91 MIL/uL (ref 4.20–5.80)
RDW: 14.3 % (ref 11.0–15.0)
WBC: 6.8 10*3/uL (ref 3.8–10.8)

## 2016-05-19 LAB — COMPLETE METABOLIC PANEL WITH GFR
ALT: 19 U/L (ref 9–46)
AST: 19 U/L (ref 10–40)
Albumin: 4.3 g/dL (ref 3.6–5.1)
Alkaline Phosphatase: 67 U/L (ref 40–115)
BILIRUBIN TOTAL: 0.5 mg/dL (ref 0.2–1.2)
BUN: 12 mg/dL (ref 7–25)
CHLORIDE: 106 mmol/L (ref 98–110)
CO2: 27 mmol/L (ref 20–31)
Calcium: 9.2 mg/dL (ref 8.6–10.3)
Creat: 0.96 mg/dL (ref 0.60–1.35)
Glucose, Bld: 94 mg/dL (ref 65–99)
POTASSIUM: 4.1 mmol/L (ref 3.5–5.3)
Sodium: 141 mmol/L (ref 135–146)
Total Protein: 6.9 g/dL (ref 6.1–8.1)

## 2016-05-19 LAB — LIPID PANEL
CHOL/HDL RATIO: 4.8 ratio (ref <5.0)
Cholesterol: 218 mg/dL — ABNORMAL HIGH (ref <200)
HDL: 45 mg/dL (ref >40)
LDL CALC: 149 mg/dL — AB (ref <100)
Triglycerides: 122 mg/dL (ref <150)
VLDL: 24 mg/dL (ref <30)

## 2016-05-19 LAB — FERRITIN: FERRITIN: 215 ng/mL (ref 20–380)

## 2016-05-19 LAB — IRON AND TIBC
%SAT: 29 % (ref 15–60)
IRON: 92 ug/dL (ref 50–180)
TIBC: 322 ug/dL (ref 250–425)
UIBC: 230 ug/dL (ref 125–400)

## 2016-05-19 NOTE — Progress Notes (Signed)
Patient here for lab visit  

## 2016-05-20 ENCOUNTER — Other Ambulatory Visit: Payer: Self-pay

## 2016-05-20 DIAGNOSIS — N529 Male erectile dysfunction, unspecified: Secondary | ICD-10-CM

## 2016-05-20 MED ORDER — SILDENAFIL CITRATE 25 MG PO TABS: 25.0000 mg | ORAL_TABLET | Freq: Every day | ORAL | 3 refills | Status: DC | PRN

## 2016-05-26 MED ORDER — ATORVASTATIN CALCIUM 40 MG PO TABS: 40.0000 mg | ORAL_TABLET | Freq: Every day | ORAL | 3 refills | Status: DC

## 2016-05-26 NOTE — Addendum Note (Signed)
Addended by: Dessa PhiFUNCHES, Esmay Amspacher on: 05/26/2016 12:23 PM   Modules accepted: Orders

## 2016-05-26 NOTE — Assessment & Plan Note (Signed)
Patient with high total cholesterol and LDL, also a smoker. At high risk for heart disease recommend lipitor 40 mg daily, start with 20 mg daily for 2 weeks, then increase to 40 mg. Lipitor to reduce risk of heart attack and stroke.

## 2016-05-27 ENCOUNTER — Telehealth: Payer: Self-pay

## 2016-05-27 MED FILL — ?ATORVASTATIN 40MG TABLET: 40 | 30 days supply | Qty: 30 | Fill #0

## 2016-05-27 NOTE — Telephone Encounter (Signed)
Pt was called and informed of lab results and medication being sent over to pharmacy. 

## 2016-06-05 ENCOUNTER — Other Ambulatory Visit: Payer: Self-pay | Admitting: Family Medicine

## 2016-06-05 DIAGNOSIS — N529 Male erectile dysfunction, unspecified: Secondary | ICD-10-CM

## 2016-06-23 MED FILL — ?ATORVASTATIN 40MG TABLET: 40 | 30 days supply | Qty: 30 | Fill #1

## 2016-06-24 ENCOUNTER — Other Ambulatory Visit: Payer: Self-pay | Admitting: Family Medicine

## 2016-06-24 DIAGNOSIS — N529 Male erectile dysfunction, unspecified: Secondary | ICD-10-CM

## 2016-07-22 MED FILL — ?ATORVASTATIN 40MG TABLET: 40 | 30 days supply | Qty: 30 | Fill #2

## 2016-07-29 ENCOUNTER — Encounter: Payer: Self-pay | Admitting: Family Medicine

## 2016-07-31 MED FILL — $VIAGRA 25 MG TABLET: 25 MG | 3 days supply | Qty: 3 | Fill #0

## 2016-08-25 MED FILL — ?ATORVASTATIN 40MG TABLET: 40 | 30 days supply | Qty: 30 | Fill #3

## 2016-08-26 MED FILL — $VIAGRA 25 MG TABLET: 25 MG | 30 days supply | Qty: 10 | Fill #1

## 2016-09-23 MED FILL — ?ATORVASTATIN 40MG TABLET: 40 | 30 days supply | Qty: 30 | Fill #4

## 2016-10-27 MED FILL — ?ATORVASTATIN 40MG TABLET: 40 | 30 days supply | Qty: 30 | Fill #5

## 2016-11-20 MED FILL — ?ATORVASTATIN 40MG TABLET: 40 | 30 days supply | Qty: 30 | Fill #6

## 2016-11-20 MED FILL — $VIAGRA 25 MG TABLET: 25 MG | 15 days supply | Qty: 5 | Fill #2

## 2017-01-25 MED FILL — ATORVASTATIN 40 MG TABLET: 40 | 30 days supply | Qty: 30 | Fill #7

## 2017-02-03 ENCOUNTER — Ambulatory Visit: Payer: Self-pay | Attending: Nurse Practitioner

## 2017-02-03 ENCOUNTER — Ambulatory Visit (HOSPITAL_BASED_OUTPATIENT_CLINIC_OR_DEPARTMENT_OTHER): Payer: Self-pay | Admitting: Nurse Practitioner

## 2017-02-03 ENCOUNTER — Encounter: Payer: Self-pay | Admitting: Nurse Practitioner

## 2017-02-03 VITALS — BP 134/82 | HR 82 | Temp 98.2°F | Resp 18 | Ht 71.0 in | Wt 170.8 lb

## 2017-02-03 DIAGNOSIS — D509 Iron deficiency anemia, unspecified: Secondary | ICD-10-CM

## 2017-02-03 DIAGNOSIS — Z23 Encounter for immunization: Secondary | ICD-10-CM | POA: Insufficient documentation

## 2017-02-03 DIAGNOSIS — E782 Mixed hyperlipidemia: Secondary | ICD-10-CM | POA: Insufficient documentation

## 2017-02-03 DIAGNOSIS — F17200 Nicotine dependence, unspecified, uncomplicated: Secondary | ICD-10-CM | POA: Insufficient documentation

## 2017-02-03 DIAGNOSIS — N529 Male erectile dysfunction, unspecified: Secondary | ICD-10-CM | POA: Insufficient documentation

## 2017-02-03 DIAGNOSIS — Z716 Tobacco abuse counseling: Secondary | ICD-10-CM

## 2017-02-03 MED ORDER — VARENICLINE TARTRATE 0.5 MG X 11 & 1 MG X 42 PO MISC: ORAL | 0 refills | Status: DC

## 2017-02-03 MED ORDER — SILDENAFIL CITRATE 50 MG PO TABS: 50.0000 mg | ORAL_TABLET | ORAL | 1 refills | Status: DC | PRN

## 2017-02-03 MED FILL — !VIAGRA 50 MG TABLET: 50 | 30 days supply | Qty: 10 | Fill #0

## 2017-02-03 NOTE — Progress Notes (Signed)
Assessment & Plan:  Jerry Jensen was seen today for establish care.  Diagnoses and all orders for this visit:  Iron deficiency anemia, unspecified iron deficiency anemia type Patient to continue on iron despite instructions that iron is not necessary at this time.   Mixed hyperlipidemia Continue lipitor as prescribed. No refills requested today. Exercise at least 150 minutes per week. Continue to work on low fat, heart healthy diet and participate in regular aerobic exercise program to control as well.  Tobacco dependence -     varenicline (CHANTIX STARTING MONTH PAK) 0.5 MG X 11 & 1 MG X 42 tablet; Take one 0.5 mg tab by mouth once daily x 3 days, increase to one 0.5 mg tab twice daily x 4 days,  increase to one 1 mg tab twice a day.  Encounter for tobacco use cessation counseling Jerry Jensen was counseled on the dangers of tobacco use, and was advised to quit. Reviewed strategies to maximize success, including removing cigarettes and smoking materials from environment, stress management and support of family/friends as well as pharmacological alternatives including: Wellbutrin, Chantix, Nicotine patch, Nicotine gum or lozenges. Smoking cessation support: smoking cessation hotline: 1-800-QUIT-NOW.  Smoking cessation classes are also available through [facility]. Call [phone] or visit our website at [email or web address].   Spent 3 minutes counseling on smoking cessation and patient is ready to quit.  Erectile dysfunction, unspecified erectile dysfunction type -     sildenafil (VIAGRA) 50 MG tablet; Take 1 tablet (50 mg total) by mouth as needed for erectile dysfunction.  Immunization due -     Flu Vaccine QUAD 6+ mos PF IM (Fluarix Quad PF)    Patient has been counseled on age-appropriate routine health concerns for screening and prevention. These are reviewed and up-to-date. Referrals have been placed accordingly. Immunizations are up-to-date or declined.     Subjective:   Chief Complaint  Patient presents with  . Establish Care    Patient is here to establish care.    HPI Jerry Jensen 46 y.o. male presents to office today to establish care as a new patient.    Iron Deficiency Anemia He reports being diagnosed over 20 years ago. His cbc and iron studies have been normal since 2015 according to records reviewed. He takes ferrous sulfate 27mg  once daily. States "I feel different when I don't take it. I get so sleepy. I know I need it". I instructed him that his iron levels and CBC were normal and he does not need to take iron at this time. He declined to discontinue. Associated signs & symptoms: fatigue and hypersomnolence. Lab Results  Component Value Date   WBC 6.8 05/19/2016   HGB 13.7 05/19/2016   HCT 40.1 05/19/2016   MCV 81.7 05/19/2016   PLT 219 05/19/2016     Mixed Hyperlipidemia He endorses medication compliance with lipitor 40mg  daily. He stopped eating beef and lamb due to his increased cholesterol levels. States "I drink a lot of green tea and it keeps me healthy".  He does not have a formal exercise routine.  There is not a family history of hyperlipidemia. There is not a family history of early ischemia heart disease. Lab Results  Component Value Date   CHOL 218 (H) 05/19/2016   CHOL 176 05/03/2014   CHOL 225 (H) 11/28/2013   Lab Results  Component Value Date   HDL 45 05/19/2016   HDL 43 05/03/2014   HDL 45 11/28/2013   Lab Results  Component Value  Date   LDLCALC 149 (H) 05/19/2016   LDLCALC 95 05/03/2014   LDLCALC 125 (H) 11/28/2013   Lab Results  Component Value Date   TRIG 122 05/19/2016   TRIG 191 (H) 05/03/2014   TRIG 276 (H) 11/28/2013   Lab Results  Component Value Date   CHOLHDL 4.8 05/19/2016   CHOLHDL 4.1 05/03/2014   CHOLHDL 5.0 11/28/2013   Erectile Dysfunction Patient complains of erectile dysfunction.  Onset of dysfunction was 2 years ago and was gradual in onset.  Patient states  the nature of difficulty is both attaining and maintaining erection. Full erections occur with intercourse and with masturbation. Partial erections occur never. Libido is not affected. Risk factors for ZO:XWRUED:NONE. Patient denies history of diabetes mellitus, cranial, spinal, or pelvic trauma, beta blocker use and hypogonadism. Patient's expectations as to sexual function to achieve and maintain an erection.  Patient's description of relationship w/partner good  Previous treatment of ED includes cialis (reports headache as side effect).    Review of Systems  Constitutional: Negative for fever, malaise/fatigue and weight loss.  HENT: Negative.  Negative for nosebleeds.   Eyes: Negative.  Negative for blurred vision, double vision and photophobia.  Respiratory: Negative.  Negative for cough and shortness of breath.   Cardiovascular: Negative.  Negative for chest pain, palpitations and leg swelling.  Gastrointestinal: Negative.  Negative for abdominal pain, constipation, diarrhea, heartburn, nausea and vomiting.  Musculoskeletal: Negative.  Negative for myalgias.  Neurological: Negative.  Negative for dizziness, focal weakness, seizures and headaches.  Endo/Heme/Allergies: Negative for environmental allergies.  Psychiatric/Behavioral: Negative.  Negative for suicidal ideas.    Past Medical History:  Diagnosis Date  . Anemia 2000  . Elevated cholesterol 12/01/2013   9/15: 10 yr CVD risk 7.5% 2/16: 10 yr CVD risk 4.0%     History reviewed. No pertinent surgical history.  Family History  Problem Relation Age of Onset  . Cancer Mother        breast   . Diabetes Sister   . Hypertension Sister   . Anemia Sister   . Heart disease Neg Hx     Social History Reviewed with no changes to be made today.   Outpatient Medications Prior to Visit  Medication Sig Dispense Refill  . atorvastatin (LIPITOR) 40 MG tablet Take 1 tablet (40 mg total) by mouth daily. 90 tablet 3  . Ferrous Sulfate 27 MG TABS  Take 27 mg by mouth daily.     . cetirizine (ZYRTEC) 10 MG tablet Take 1 tablet (10 mg total) by mouth daily. (Patient not taking: Reported on 05/15/2016) 30 tablet 11  . VIAGRA 25 MG tablet TAKE 1-4 TABLETS BY MOUTH DAILY AS NEEDED FOR ERECTILE DYSFUNCTION. (Patient not taking: Reported on 02/03/2017) 3 tablet 5   No facility-administered medications prior to visit.     No Known Allergies     Objective:    BP 134/82 (BP Location: Left Arm, Patient Position: Sitting, Cuff Size: Normal)   Pulse 82   Temp 98.2 F (36.8 C) (Oral)   Resp 18   Ht 5\' 11"  (1.803 m)   Wt 170 lb 12.8 oz (77.5 kg)   SpO2 100%   BMI 23.82 kg/m  Wt Readings from Last 3 Encounters:  02/03/17 170 lb 12.8 oz (77.5 kg)  05/15/16 169 lb 3.2 oz (76.7 kg)  12/06/14 171 lb (77.6 kg)    Physical Exam  Constitutional: He is oriented to person, place, and time. He appears well-developed and well-nourished. He is  cooperative.  HENT:  Head: Normocephalic and atraumatic.  Eyes: EOM are normal.  Neck: Normal range of motion.  Cardiovascular: Normal rate, regular rhythm, normal heart sounds and intact distal pulses. Exam reveals no gallop and no friction rub.  No murmur heard. Pulmonary/Chest: Effort normal and breath sounds normal. No tachypnea. No respiratory distress. He has no decreased breath sounds. He has no wheezes. He has no rhonchi. He has no rales. He exhibits no tenderness.  Abdominal: Soft. Bowel sounds are normal.  Musculoskeletal: Normal range of motion. He exhibits no edema.  Neurological: He is alert and oriented to person, place, and time. Coordination normal.  Skin: Skin is warm and dry.  Psychiatric: He has a normal mood and affect. His behavior is normal. Judgment and thought content normal.  Nursing note and vitals reviewed.        Patient has been counseled extensively about nutrition and exercise as well as the importance of adherence with medications and regular follow-up. The patient was  given clear instructions to go to ER or return to medical center if symptoms don't improve, worsen or new problems develop. The patient verbalized understanding.   Follow-up: Return in about 4 months (around 06/03/2017) for Fasting labs and physical .   Claiborne Rigg, FNP-BC [facility] and [facility] [place], [place] [phone]   02/03/2017, 10:15 PM

## 2017-02-03 NOTE — Patient Instructions (Addendum)
Blood Transfusion, Adult A blood transfusion is a procedure in which you receive donated blood, including plasma, platelets, and red blood cells, through an IV tube. You may need a blood transfusion because of illness, surgery, or injury. The blood may come from a donor. You may also be able to donate blood for yourself (autologous blood donation) before a surgery if you know that you might require a blood transfusion. The blood given in a transfusion is made up of different types of cells. You may receive:  Red blood cells. These carry oxygen to the cells in the body.  White blood cells. These help you fight infections.  Platelets. These help your blood to clot.  Plasma. This is the liquid part of your blood and it helps with fluid imbalances.  If you have hemophilia or another clotting disorder, you may also receive other types of blood products. Tell a health care provider about:  Any allergies you have.  All medicines you are taking, including vitamins, herbs, eye drops, creams, and over-the-counter medicines.  Any problems you or family members have had with anesthetic medicines.  Any blood disorders you have.  Any surgeries you have had.  Any medical conditions you have, including any recent fever or cold symptoms.  Whether you are pregnant or may be pregnant.  Any previous reactions you have had during a blood transfusion. What are the risks? Generally, this is a safe procedure. However, problems may occur, including:  Having an allergic reaction to something in the donated blood. Hives and itching may be symptoms of this type of reaction.  Fever. This may be a reaction to the white blood cells in the transfused blood. Nausea or chest pain may accompany a fever.  Iron overload. This can happen from having many transfusions.  Transfusion-related acute lung injury (TRALI). This is a rare reaction that causes lung damage. The cause is not known.TRALI can occur within hours  of a transfusion or several days later.  Sudden (acute) or delayed hemolytic reactions. This happens if your blood does not match the cells in your transfusion. Your body's defense system (immune system) may try to attack the new cells. This complication is rare. The symptoms include fever, chills, nausea, and low back pain or chest pain.  Infection or disease transmission. This is rare.  What happens before the procedure?  You will have a blood test to determine your blood type. This is necessary to know what kind of blood your body will accept and to match it to the donor blood.  If you are going to have a planned surgery, you may be able to do an autologous blood donation. This may be done in case you need to have a transfusion.  If you have had an allergic reaction to a transfusion in the past, you may be given medicine to help prevent a reaction. This medicine may be given to you by mouth or through an IV tube.  You will have your temperature, blood pressure, and pulse monitored before the transfusion.  Follow instructions from your health care provider about eating and drinking restrictions.  Ask your health care provider about: ? Changing or stopping your regular medicines. This is especially important if you are taking diabetes medicines or blood thinners. ? Taking medicines such as aspirin and ibuprofen. These medicines can thin your blood. Do not take these medicines before your procedure if your health care provider instructs you not to. What happens during the procedure?  An IV tube will   be inserted into one of your veins.  The bag of donated blood will be attached to your IV tube. The blood will then enter through your vein.  Your temperature, blood pressure, and pulse will be monitored regularly during the transfusion. This monitoring is done to detect early signs of a transfusion reaction.  If you have any signs or symptoms of a reaction, your transfusion will be stopped  and you may be given medicine.  When the transfusion is complete, your IV tube will be removed.  Pressure may be applied to the IV site for a few minutes.  A bandage (dressing) will be applied. The procedure may vary among health care providers and hospitals. What happens after the procedure?  Your temperature, blood pressure, heart rate, breathing rate, and blood oxygen level will be monitored often.  Your blood may be tested to see how you are responding to the transfusion.  You may be warmed with fluids or blankets to maintain a normal body temperature. Summary  A blood transfusion is a procedure in which you receive donated blood, including plasma, platelets, and red blood cells, through an IV tube.  Your temperature, blood pressure, and pulse will be monitored before, during, and after the transfusion.  Your blood may be tested after the transfusion to see how your body has responded. This information is not intended to replace advice given to you by your health care provider. Make sure you discuss any questions you have with your health care provider. Document Released: 02/28/2000 Document Revised: 11/28/2015 Document Reviewed: 11/28/2015 Elsevier Interactive Patient Education  2018 [employer].  Cholesterol Cholesterol is a fat. Your body needs a small amount of cholesterol. Cholesterol (plaque) may build up in your blood vessels (arteries). That makes you more likely to have a heart attack or stroke. You cannot feel your cholesterol level. Having a blood test is the only way to find out if your level is high. Keep your test results. Work with your doctor to keep your cholesterol at a good level. What do the results mean?  Total cholesterol is how much cholesterol is in your blood.  LDL is bad cholesterol. This is the type that can build up. Try to have low LDL.  HDL is good cholesterol. It cleans your blood vessels and carries LDL away. Try to have high  HDL.  Triglycerides are fat that the body can store or burn for energy. What are good levels of cholesterol?  Total cholesterol below 200.  LDL below 100 is good for people who have health risks. LDL below 70 is good for people who have very high risks.  HDL above 40 is good. It is best to have HDL of 60 or higher.  Triglycerides below 150. How can I lower my cholesterol? Diet Follow your diet program as told by your doctor.  Choose fish, white meat chicken, or [place] that is roasted or baked. Try not to eat red meat, fried foods, sausage, or lunch meats.  Eat lots of fresh fruits and vegetables.  Choose whole grains, beans, pasta, potatoes, and cereals.  Choose olive oil, corn oil, or canola oil. Only use small amounts.  Try not to eat butter, mayonnaise, shortening, or palm kernel oils.  Try not to eat foods with trans fats.  Choose low-fat or nonfat dairy foods. ? Drink skim or nonfat milk. ? Eat low-fat or nonfat yogurt and cheeses. ? Try not to drink whole milk or cream. ? Try not to eat ice cream, egg  yolks, or full-fat cheeses.  Healthy desserts include angel food cake, ginger snaps, animal crackers, hard candy, popsicles, and low-fat or nonfat frozen yogurt. Try not to eat pastries, cakes, pies, and cookies.  Exercise Follow your exercise program as told by your doctor.  Be more active. Try gardening, walking, and taking the stairs.  Ask your doctor about ways that you can be more active.  Medicine  Take over-the-counter and prescription medicines only as told by your doctor.  This information is not intended to replace advice given to you by your health care provider. Make sure you discuss any questions you have with your health care provider. Document Released: 05/29/2008 Document Revised: 10/02/2015 Document Reviewed: 09/12/2015 Elsevier Interactive Patient Education  2017 [employer]  Tobacco Use Disorder Tobacco use disorder (TUD) is a mental  disorder. It is the long-term use of tobacco in spite of related health problems or difficulty with normal life activities. Tobacco is most commonly smoked as cigarettes and less commonly as cigars or pipes. Smokeless chewing tobacco and snuff are also popular. People with TUD get a feeling of extreme pleasure (euphoria) from using tobacco and have a desire to use it again and again. Repeated use of tobacco can cause problems. The addictive effects of tobacco are due mainly tothe ingredient nicotine. Nicotine also causes a rush of adrenaline (epinephrine) in the body. This leads to increased blood pressure, heart rate, and breathing rate. These changes may cause problems for people with high blood pressure, weak hearts, or lung disease. High doses of nicotine in children and pets can lead to seizures and death. Tobacco contains a number of other unsafe chemicals. These chemicals are especially harmful when inhaled as smoke and can damage almost every organ in the body. Smokers live shorter lives than nonsmokers and are at risk of dying from a number of diseases and cancers. Tobacco smoke can also cause health problems for nonsmokers (due to inhaling secondhand smoke). Smoking is also a fire hazard. TUD usually starts in the late teenage years and is most common in young adults between the ages of 5818 and 25 years. People who start smoking earlier in life are more likely to continue smoking as adults. TUD is somewhat more common in men than women. People with TUD are at higher risk for using alcohol and other drugs of abuse. What increases the risk? Risk factors for TUD include:  Having family members with the disorder.  Being around people who use tobacco.  Having an existing mental health issue such as schizophrenia, depression, bipolar disorder, ADHD, or posttraumatic stress disorder (PTSD).  What are the signs or symptoms? People with tobacco use disorder have two or more of the following signs and  symptoms within 12 months:  Use of more tobacco over a longer period than intended.  Not able to cut down or control tobacco use.  A lot of time spent obtaining or using tobacco.  Strong desire or urge to use tobacco (craving). Cravings may last for 6 months or longer after quitting.  Use of tobacco even when use leads to major problems at work, school, or home.  Use of tobacco even when use leads to relationship problems.  Giving up or cutting down on important life activities because of tobacco use.  Repeatedly using tobacco in situations where it puts you or others in physical danger, like smoking in bed.  Use of tobacco even when it is known that a physical or mental problem is likely related to tobacco  use. ? Physical problems are numerous and may include chronic bronchitis, emphysema, lung and other cancers, gum disease, high blood pressure, heart disease, and stroke. ? Mental problems caused by tobacco may include difficulty sleeping and anxiety.  Need to use greater amounts of tobacco to get the same effect. This means you have developed a tolerance.  Withdrawal symptoms as a result of stopping or rapidly cutting back use. These symptoms may last a month or more after quitting and include the following: ? Depressed, anxious, or irritable mood. ? Difficulty concentrating. ? Increased appetite. ? Restlessness or trouble sleeping. ? Use of tobacco to avoid withdrawal symptoms.  How is this diagnosed? Tobacco use disorder is diagnosed by your health care provider. A diagnosis may be made by:  Your health care provider asking questions about your tobacco use and any problems it may be causing.  A physical exam.  Lab tests.  You may be referred to a mental health professional or addiction specialist.  The severity of tobacco use disorder depends on the number of signs and symptoms you have:  Mild-Two or three symptoms.  Moderate-Four or five symptoms.  Severe-Six or  more symptoms.  How is this treated? Many people with tobacco use disorder are unable to quit on their own and need help. Treatment options include the following:  Nicotine replacement therapy (NRT). NRT provides nicotine without the other harmful chemicals in tobacco. NRT gradually lowers the dosage of nicotine in the body and reduces withdrawal symptoms. NRT is available in over-the-counter forms (gum, lozenges, and skin patches) as well as prescription forms (mouth inhaler and nasal spray).  Medicines.This may include: ? Antidepressant medicine that may reduce nicotine cravings. ? A medicine that acts on nicotine receptors in the brain to reduce cravings and withdrawal symptoms. It may also block the effects of tobacco in people with TUD who relapse.  Counseling or talk therapy. A form of talk therapy called behavioral therapy is commonly used to treat people with TUD. Behavioral therapy looks at triggers for tobacco use, how to avoid them, and how to cope with cravings. It is most effective in person or by phone but is also available in self-help forms (books and Internet websites).  Support groups. These provide emotional support, advice, and guidance for quitting tobacco.  The most effective treatment for TUD is usually a combination of medicine, talk therapy, and support groups. Follow these instructions at home:  Keep all follow-up visits as directed by your health care provider. This is important.  Take medicines only as directed by your health care provider.  Check with your health care provider before starting new prescription or over-the-counter medicines. Contact a health care provider if:  You are not able to take your medicines as prescribed.  Treatment is not helping your TUD and your symptoms get worse. Get help right away if:  You have serious thoughts about hurting yourself or others.  You have trouble breathing, chest pain, sudden weakness, or sudden numbness in  part of your body. This information is not intended to replace advice given to you by your health care provider. Make sure you discuss any questions you have with your health care provider. Document Released: 11/06/2003 Document Revised: 11/03/2015 Document Reviewed: 04/28/2013 Elsevier Interactive Patient Education  [employer]2018 [employer]

## 2017-02-22 MED FILL — ?ATORVASTATIN 40MG TABLET: 40 | 30 days supply | Qty: 30 | Fill #8

## 2017-03-22 MED FILL — ?ATORVASTATIN 40MG TABLET: 40 | 30 days supply | Qty: 30 | Fill #9

## 2017-04-08 MED FILL — $CHANTIX STARTING MONTH BOX: 0.5 MG X 11 | 30 days supply | Qty: 53 | Fill #0

## 2017-04-08 MED FILL — $Viagra 50mg tablet: 50 | 30 days supply | Qty: 10 | Fill #1

## 2017-05-10 ENCOUNTER — Other Ambulatory Visit: Payer: Self-pay

## 2017-05-10 ENCOUNTER — Encounter: Payer: Self-pay | Admitting: Nurse Practitioner

## 2017-05-10 ENCOUNTER — Ambulatory Visit: Payer: Self-pay | Attending: Nurse Practitioner | Admitting: Nurse Practitioner

## 2017-05-10 VITALS — BP 145/95 | HR 71 | Temp 98.2°F | Resp 16 | Wt 172.0 lb

## 2017-05-10 DIAGNOSIS — Z79899 Other long term (current) drug therapy: Secondary | ICD-10-CM | POA: Insufficient documentation

## 2017-05-10 DIAGNOSIS — E7800 Pure hypercholesterolemia, unspecified: Secondary | ICD-10-CM | POA: Insufficient documentation

## 2017-05-10 DIAGNOSIS — F17210 Nicotine dependence, cigarettes, uncomplicated: Secondary | ICD-10-CM | POA: Insufficient documentation

## 2017-05-10 DIAGNOSIS — Z0000 Encounter for general adult medical examination without abnormal findings: Secondary | ICD-10-CM | POA: Insufficient documentation

## 2017-05-10 DIAGNOSIS — N529 Male erectile dysfunction, unspecified: Secondary | ICD-10-CM | POA: Insufficient documentation

## 2017-05-10 DIAGNOSIS — F17200 Nicotine dependence, unspecified, uncomplicated: Secondary | ICD-10-CM

## 2017-05-10 MED ORDER — VARDENAFIL HCL 20 MG PO TABS: 20.0000 mg | ORAL_TABLET | Freq: Every day | ORAL | 1 refills | Status: DC | PRN

## 2017-05-10 MED FILL — ?ATORVASTATIN 40MG TABLET: 40 | 30 days supply | Qty: 30 | Fill #10

## 2017-05-10 NOTE — Patient Instructions (Addendum)
Health Maintenance, Male A healthy lifestyle and preventive care is important for your health and wellness. Ask your health care provider about what schedule of regular examinations is right for you. What should I know about weight and diet? Eat a Healthy Diet  Eat plenty of vegetables, fruits, whole grains, low-fat dairy products, and lean protein.  Do not eat a lot of foods high in solid fats, added sugars, or salt.  Maintain a Healthy Weight Regular exercise can help you achieve or maintain a healthy weight. You should:  Do at least 150 minutes of exercise each week. The exercise should increase your heart rate and make you sweat (moderate-intensity exercise).  Do strength-training exercises at least twice a week.  Watch Your Levels of Cholesterol and Blood Lipids  Have your blood tested for lipids and cholesterol every 5 years starting at 48 years of age. If you are at high risk for heart disease, you should start having your blood tested when you are 48 years old. You may need to have your cholesterol levels checked more often if: ? Your lipid or cholesterol levels are high. ? You are older than 48 years of age. ? You are at high risk for heart disease.  What should I know about cancer screening? Many types of cancers can be detected early and may often be prevented. Lung Cancer  You should be screened every year for lung cancer if: ? You are a current smoker who has smoked for at least 30 years. ? You are a former smoker who has quit within the past 15 years.  Talk to your health care provider about your screening options, when you should start screening, and how often you should be screened.  Colorectal Cancer  Routine colorectal cancer screening usually begins at 48 years of age and should be repeated every 5-10 years until you are 48 years old. You may need to be screened more often if early forms of precancerous polyps or small growths are found. Your health care provider  may recommend screening at an earlier age if you have risk factors for colon cancer.  Your health care provider may recommend using home test kits to check for hidden blood in the stool.  A small camera at the end of a tube can be used to examine your colon (sigmoidoscopy or colonoscopy). This checks for the earliest forms of colorectal cancer.  Prostate and Testicular Cancer  Depending on your age and overall health, your health care provider may do certain tests to screen for prostate and testicular cancer.  Talk to your health care provider about any symptoms or concerns you have about testicular or prostate cancer.  Skin Cancer  Check your skin from head to toe regularly.  Tell your health care provider about any new moles or changes in moles, especially if: ? There is a change in a mole's size, shape, or color. ? You have a mole that is larger than a pencil eraser.  Always use sunscreen. Apply sunscreen liberally and repeat throughout the day.  Protect yourself by wearing long sleeves, pants, a wide-brimmed hat, and sunglasses when outside.  What should I know about heart disease, diabetes, and high blood pressure?  If you are 18-39 years of age, have your blood pressure checked every 3-5 years. If you are 40 years of age or older, have your blood pressure checked every year. You should have your blood pressure measured twice-once when you are at a hospital or clinic, and once when   you are not at a hospital or clinic. Record the average of the two measurements. To check your blood pressure when you are not at a hospital or clinic, you can use: ? An automated blood pressure machine at a pharmacy. ? A home blood pressure monitor.  Talk to your health care provider about your target blood pressure.  If you are between 16-64 years old, ask your health care provider if you should take aspirin to prevent heart disease.  Have regular diabetes screenings by checking your fasting blood  sugar level. ? If you are at a normal weight and have a low risk for diabetes, have this test once every three years after the age of 23. ? If you are overweight and have a high risk for diabetes, consider being tested at a younger age or more often.  A one-time screening for abdominal aortic aneurysm (AAA) by ultrasound is recommended for men aged 65-75 years who are current or former smokers. What should I know about preventing infection? Hepatitis B If you have a higher risk for hepatitis B, you should be screened for this virus. Talk with your health care provider to find out if you are at risk for hepatitis B infection. Hepatitis C Blood testing is recommended for:  Everyone born from [birth year] through 1966.  Anyone with known risk factors for hepatitis C.  Sexually Transmitted Diseases (STDs)  You should be screened each year for STDs including gonorrhea and chlamydia if: ? You are sexually active and are younger than 48 years of age. ? You are older than 48 years of age and your health care provider tells you that you are at risk for this type of infection. ? Your sexual activity has changed since you were last screened and you are at an increased risk for chlamydia or gonorrhea. Ask your health care provider if you are at risk.  Talk with your health care provider about whether you are at high risk of being infected with HIV. Your health care provider may recommend a prescription medicine to help prevent HIV infection.  What else can I do?  Schedule regular health, dental, and eye exams.  Stay current with your vaccines (immunizations).  Do not use any tobacco products, such as cigarettes, chewing tobacco, and e-cigarettes. If you need help quitting, ask your health care provider.  Limit alcohol intake to no more than 2 drinks per day. One drink equals 12 ounces of beer, 5 ounces of wine, or 1 ounces of hard liquor.  Do not use street drugs.  Do not share needles.  Ask your  health care provider for help if you need support or information about quitting drugs.  Tell your health care provider if you often feel depressed.  Tell your health care provider if you have ever been abused or do not feel safe at home. This information is not intended to replace advice given to you by your health care provider. Make sure you discuss any questions you have with your health care provider. Document Released: 08/29/2007 Document Revised: 10/30/2015 Document Reviewed: 12/04/2014 Elsevier Interactive Patient Education  2018 [employer].  Erectile Dysfunction Erectile dysfunction (ED) is the inability to get or keep an erection in order to have sexual intercourse. Erectile dysfunction may include:  Inability to get an erection.  Lack of enough hardness of the erection to allow penetration.  Loss of the erection before sex is finished.  What are the causes? This condition may be caused by:  Certain medicines,  such as: ? Pain relievers. ? Antihistamines. ? Antidepressants. ? Blood pressure medicines. ? Water pills (diuretics). ? Ulcer medicines. ? Muscle relaxants. ? Drugs.  Excessive drinking.  Psychological causes, such as: ? Anxiety. ? Depression. ? Sadness. ? Exhaustion. ? Performance fear. ? Stress.  Physical causes, such as: ? Artery problems. This may include diabetes, smoking, liver disease, or atherosclerosis. ? High blood pressure. ? Hormonal problems, such as low testosterone. ? Obesity. ? Nerve problems. This may include back or pelvic injuries, diabetes mellitus, multiple sclerosis, or Parkinson disease.  What are the signs or symptoms? Symptoms of this condition include:  Inability to get an erection.  Lack of enough hardness of the erection to allow penetration.  Loss of the erection before sex is finished.  Normal erections at some times, but with frequent unsatisfactory episodes.  Low sexual satisfaction in either partner due to  erection problems.  A curved penis occurring with erection. The curve may cause pain or the penis may be too curved to allow for intercourse.  Never having nighttime erections.  How is this diagnosed? This condition is often diagnosed by:  Performing a physical exam to find other diseases or specific problems with the penis.  Asking you detailed questions about the problem.  Performing blood tests to check for diabetes mellitus or to measure hormone levels.  Performing other tests to check for underlying health conditions.  Performing an ultrasound exam to check for scarring.  Performing a test to check blood flow to the penis.  Doing a sleep study at home to measure nighttime erections.  How is this treated? This condition may be treated by:  Medicine taken by mouth to help you achieve an erection (oral medicine).  Hormone replacement therapy to replace low testosterone levels.  Medicine that is injected into the penis. Your health care provider may instruct you how to give yourself these injections at home.  Vacuum pump. This is a pump with a ring on it. The pump and ring are placed on the penis and used to create pressure that helps the penis become erect.  Penile implant surgery. In this procedure, you may receive: ? An inflatable implant. This consists of cylinders, a pump, and a reservoir. The cylinders can be inflated with a fluid that helps to create an erection, and they can be deflated after intercourse. ? A semi-rigid implant. This consists of two silicone rubber rods. The rods provide some rigidity. They are also flexible, so the penis can both curve downward in its normal position and become straight for sexual intercourse.  Blood vessel surgery, to improve blood flow to the penis. During this procedure, a blood vessel from a different part of the body is placed into the penis to allow blood to flow around (bypass) damaged or blocked blood vessels.  Lifestyle  changes, such as exercising more, losing weight, and quitting smoking.  Follow these instructions at home: Medicines  Take over-the-counter and prescription medicines only as told by your health care provider. Do not increase the dosage without first discussing it with your health care provider.  If you are using self-injections, perform injections as directed by your health care provider. Make sure to avoid any veins that are on the surface of the penis. After giving an injection, apply pressure to the injection site for 5 minutes. General instructions  Exercise regularly, as directed by your health care provider. Work with your health care provider to lose weight, if needed.  Do not use any products  that contain nicotine or tobacco, such as cigarettes and e-cigarettes. If you need help quitting, ask your health care provider.  Before using a vacuum pump, read the instructions that come with the pump and discuss any questions with your health care provider.  Keep all follow-up visits as told by your health care provider. This is important. Contact a health care provider if:  You feel nauseous.  You vomit. Get help right away if:  You are taking oral or injectable medicines and you have an erection that lasts longer than 4 hours. If your health care provider is unavailable, go to the nearest emergency room for evaluation. An erection that lasts much longer than 4 hours can result in permanent damage to your penis.  You have severe pain in your groin or abdomen.  You develop redness or severe swelling of your penis.  You have redness spreading up into your groin or lower abdomen.  You are unable to urinate.  You experience chest pain or a rapid heart beat (palpitations) after taking oral medicines. Summary  Erectile dysfunction (ED) is the inability to get or keep an erection during sexual intercourse. This problem can usually be treated successfully.  This condition is  diagnosed based on a physical exam, your symptoms, and tests to determine the cause. Treatment varies depending on the cause, and may include medicines, hormone therapy, surgery, or vacuum pump.  You may need follow-up visits to make sure that you are using your medicines or devices correctly.  Get help right away if you are taking or injecting medicines and you have an erection that lasts longer than 4 hours. This information is not intended to replace advice given to you by your health care provider. Make sure you discuss any questions you have with your health care provider. Document Released: 02/28/2000 Document Revised: 03/18/2016 Document Reviewed: 03/18/2016 Elsevier Interactive Patient Education  2017 [employer]

## 2017-05-10 NOTE — Progress Notes (Signed)
Assessment & Plan:  Jerry Jensen was seen today for annual exam.  Diagnoses and all orders for this visit:  PE (physical exam), annual -     Basic metabolic panel -     CBC -     Lipid panel  Tobacco dependence He has not started Chantix Jerry Jensen was counseled on the dangers of tobacco use, and was advised to quit. Reviewed strategies to maximize success, including removing cigarettes and smoking materials from environment, stress management and support of family/friends as well as pharmacological alternatives including: Wellbutrin, Chantix, Nicotine patch, Nicotine gum or lozenges. Smoking cessation support: smoking cessation hotline: 1-800-QUIT-NOW.  Smoking cessation classes are also available through [facility]. Call [phone] or visit our website at [email or web address].   Spent 3 minutes counseling on smoking cessation and patient is ready to quit.  Erectile dysfunction, unspecified erectile dysfunction type -     vardenafil (LEVITRA) 20 MG tablet; Take 1 tablet (20 mg total) by mouth daily as needed for erectile dysfunction. Reports the viagra gives him a headache.     Patient has been counseled on age-appropriate routine health concerns for screening and prevention. These are reviewed and up-to-date. Referrals have been placed accordingly. Immunizations are up-to-date or declined.    Subjective:   Chief Complaint  Patient presents with  . Annual Exam   HPI Jerry Jensen 47 y.o. male presents to office today for annual exam. He was started on Chantix for smoking cessation at his last office visit here in November 2018.  Today he states he did not start taking it because he read some of the side effects and did not want to start the medication. I instructed him that he could start the medication and if he develops unwanted side effects he can stop taking it at that time. He is agreeable to start. He also has stopped taking his viagra due to  headaches. Will switch to levitra.     Review of Systems  Constitutional: Negative for fever, malaise/fatigue and weight loss.  HENT: Negative.  Negative for nosebleeds.   Eyes: Negative.  Negative for blurred vision, double vision and photophobia.  Respiratory: Negative.  Negative for cough and shortness of breath.   Cardiovascular: Negative.  Negative for chest pain, palpitations and leg swelling.  Gastrointestinal: Negative.  Negative for abdominal pain, constipation, diarrhea, heartburn, nausea and vomiting.  Genitourinary: Negative.   Musculoskeletal: Negative.  Negative for myalgias.  Skin: Negative.   Neurological: Negative.  Negative for dizziness, focal weakness, seizures and headaches.  Endo/Heme/Allergies: Negative for environmental allergies.  Psychiatric/Behavioral: Negative.  Negative for suicidal ideas.    Past Medical History:  Diagnosis Date  . Anemia 2000  . Elevated cholesterol 12/01/2013   9/15: 10 yr CVD risk 7.5% 2/16: 10 yr CVD risk 4.0%     No past surgical history on file.  Family History  Problem Relation Age of Onset  . Cancer Mother        breast   . Diabetes Sister   . Hypertension Sister   . Anemia Sister   . Heart disease Neg Hx     Social History Reviewed with no changes to be made today.   Outpatient Medications Prior to Visit  Medication Sig Dispense Refill  . atorvastatin (LIPITOR) 40 MG tablet Take 1 tablet (40 mg total) by mouth daily. 90 tablet 3  . Ferrous Sulfate 27 MG TABS Take 27 mg by mouth daily.     . varenicline (CHANTIX STARTING MONTH  PAK) 0.5 MG X 11 & 1 MG X 42 tablet Take one 0.5 mg tab by mouth once daily x 3 days, increase to one 0.5 mg tab twice daily x 4 days,  increase to one 1 mg tab twice a day. (Patient not taking: Reported on 05/10/2017) 53 tablet 0  . sildenafil (VIAGRA) 50 MG tablet Take 1 tablet (50 mg total) by mouth as needed for erectile dysfunction. 10 tablet 1   No facility-administered medications prior  to visit.     No Known Allergies     Objective:    BP (!) 145/95 (BP Location: Right Arm, Patient Position: Sitting, Cuff Size: Normal)   Pulse 71   Temp 98.2 F (36.8 C) (Oral)   Resp 16   Wt 172 lb (78 kg)   SpO2 99%   BMI 23.99 kg/m  Wt Readings from Last 3 Encounters:  05/10/17 172 lb (78 kg)  02/03/17 170 lb 12.8 oz (77.5 kg)  05/15/16 169 lb 3.2 oz (76.7 kg)    Physical Exam  Constitutional: He is oriented to person, place, and time. He appears well-developed and well-nourished.  HENT:  Head: Normocephalic and atraumatic.  Right Ear: External ear normal.  Left Ear: External ear normal.  Nose: Mucosal edema present.  Mouth/Throat: Oropharynx is clear and moist.  Eyes: Conjunctivae and EOM are normal. Pupils are equal, round, and reactive to light. No scleral icterus.  Neck: Normal range of motion. Neck supple. No tracheal deviation present. No thyromegaly present.  Cardiovascular: Normal rate, regular rhythm, normal heart sounds and intact distal pulses. Exam reveals no gallop and no friction rub.  No murmur heard. Pulmonary/Chest: Effort normal and breath sounds normal. No respiratory distress. He has no wheezes. He has no rales. He exhibits no mass and no tenderness. Right breast exhibits no inverted nipple, no mass, no nipple discharge, no skin change and no tenderness. Left breast exhibits no inverted nipple, no mass, no nipple discharge, no skin change and no tenderness.  Abdominal: Soft. Bowel sounds are normal. He exhibits no distension and no mass. There is no tenderness. There is no rebound and no guarding. Hernia confirmed negative in the right inguinal area and confirmed negative in the left inguinal area.  Genitourinary: Testes normal and penis normal. Right testis shows no mass and no swelling. Right testis is descended. Cremasteric reflex is not absent on the right side. Left testis shows no mass, no swelling and no tenderness. Left testis is descended.  Cremasteric reflex is not absent on the left side. Circumcised. No penile tenderness.  Musculoskeletal: Normal range of motion. He exhibits no edema, tenderness or deformity.  Lymphadenopathy:    He has no cervical adenopathy.       Right: No inguinal adenopathy present.       Left: No inguinal adenopathy present.  Neurological: He is alert and oriented to person, place, and time. He displays normal reflexes. No cranial nerve deficit. He exhibits normal muscle tone. Coordination normal.  Skin: Skin is warm and dry. No erythema.  Psychiatric: He has a normal mood and affect. His behavior is normal. Judgment and thought content normal.         Patient has been counseled extensively about nutrition and exercise as well as the importance of adherence with medications and regular follow-up. The patient was given clear instructions to go to ER or return to medical center if symptoms don't improve, worsen or new problems develop. The patient verbalized understanding.   Follow-up: Return in  about 6 months (around 11/07/2017).   Claiborne Rigg, FNP-BC [facility] [place], [place] [phone]   05/10/2017, 10:18 AM

## 2017-05-10 NOTE — Progress Notes (Signed)
Physical exam

## 2017-05-11 LAB — CBC
HEMATOCRIT: 40.5 % (ref 37.5–51.0)
HEMOGLOBIN: 14.3 g/dL (ref 13.0–17.7)
MCH: 29.5 pg (ref 26.6–33.0)
MCHC: 35.3 g/dL (ref 31.5–35.7)
MCV: 84 fL (ref 79–97)
Platelets: 261 10*3/uL (ref 150–379)
RBC: 4.84 x10E6/uL (ref 4.14–5.80)
RDW: 13.2 % (ref 12.3–15.4)
WBC: 8.1 10*3/uL (ref 3.4–10.8)

## 2017-05-11 LAB — BASIC METABOLIC PANEL
BUN/Creatinine Ratio: 6 — ABNORMAL LOW (ref 9–20)
BUN: 5 mg/dL — AB (ref 6–24)
CALCIUM: 9.7 mg/dL (ref 8.7–10.2)
CO2: 24 mmol/L (ref 20–29)
Chloride: 102 mmol/L (ref 96–106)
Creatinine, Ser: 0.88 mg/dL (ref 0.76–1.27)
GFR, EST AFRICAN AMERICAN: 117 mL/min/{1.73_m2} (ref >59)
GFR, EST NON AFRICAN AMERICAN: 102 mL/min/{1.73_m2} (ref >59)
Glucose: 106 mg/dL — ABNORMAL HIGH (ref 65–99)
Potassium: 4.4 mmol/L (ref 3.5–5.2)
Sodium: 141 mmol/L (ref 134–144)

## 2017-05-11 LAB — LIPID PANEL
Chol/HDL Ratio: 4.5 ratio (ref 0.0–5.0)
Cholesterol, Total: 253 mg/dL — ABNORMAL HIGH (ref 100–199)
HDL: 56 mg/dL (ref >39)
LDL CALC: 168 mg/dL — AB (ref 0–99)
Triglycerides: 147 mg/dL (ref 0–149)
VLDL Cholesterol Cal: 29 mg/dL (ref 5–40)

## 2017-05-16 ENCOUNTER — Other Ambulatory Visit: Payer: Self-pay | Admitting: Nurse Practitioner

## 2017-05-16 DIAGNOSIS — E7800 Pure hypercholesterolemia, unspecified: Secondary | ICD-10-CM

## 2017-05-16 MED ORDER — ATORVASTATIN CALCIUM 40 MG PO TABS: 40.0000 mg | ORAL_TABLET | Freq: Every day | ORAL | 3 refills | Status: DC

## 2017-05-17 ENCOUNTER — Ambulatory Visit: Payer: Self-pay | Attending: Nurse Practitioner | Admitting: *Deleted

## 2017-05-17 DIAGNOSIS — H6122 Impacted cerumen, left ear: Secondary | ICD-10-CM | POA: Insufficient documentation

## 2017-05-19 ENCOUNTER — Telehealth: Payer: Self-pay

## 2017-05-19 NOTE — Telephone Encounter (Signed)
CMA spoke to patient to inform lab results and PCP advising.  Patient understood.  No questions.

## 2017-05-20 ENCOUNTER — Ambulatory Visit: Payer: Self-pay | Attending: Nurse Practitioner

## 2017-06-04 NOTE — Progress Notes (Signed)
Ceruminosis is noted.  Wax is removed by elephant ear device. Pt tolerated procedure well.

## 2017-06-11 MED FILL — ?ATORVASTATIN 40MG TABLET: 40 | 30 days supply | Qty: 30 | Fill #0

## 2017-07-06 MED FILL — ?ATORVASTATIN 40MG TABLET: 40 | 30 days supply | Qty: 30 | Fill #1

## 2017-08-05 MED FILL — ?ATORVASTATIN 40MG TABLET: 40 | 30 days supply | Qty: 30 | Fill #2

## 2017-09-13 MED FILL — ?ATORVASTATIN 40MG TABLET: 40 | 30 days supply | Qty: 30 | Fill #3

## 2017-10-11 MED FILL — ?ATORVASTATIN 40MG TABLET: 40 | 30 days supply | Qty: 30 | Fill #4

## 2017-11-10 MED FILL — ?ATORVASTATIN 40MG TABLET: 40 | 30 days supply | Qty: 30 | Fill #5

## 2017-11-30 ENCOUNTER — Ambulatory Visit: Payer: Medicaid Other

## 2017-12-09 ENCOUNTER — Ambulatory Visit: Payer: Self-pay | Attending: Nurse Practitioner

## 2017-12-09 MED FILL — ?ATORVASTATIN 40MG TABLET: 40 | 30 days supply | Qty: 30 | Fill #6

## 2017-12-29 ENCOUNTER — Ambulatory Visit: Payer: Medicaid Other | Admitting: Nurse Practitioner

## 2018-01-05 MED FILL — ?ATORVASTATIN 40MG TABLET: 40 | 30 days supply | Qty: 30 | Fill #7

## 2018-02-08 ENCOUNTER — Ambulatory Visit (HOSPITAL_COMMUNITY)
Admission: EM | Admit: 2018-02-08 | Discharge: 2018-02-08 | Disposition: A | Discharge status: Home / Self Care | Payer: Medicaid Other | Attending: Family Medicine | Admitting: Family Medicine

## 2018-02-08 ENCOUNTER — Telehealth (HOSPITAL_COMMUNITY): Payer: Self-pay

## 2018-02-08 ENCOUNTER — Telehealth (HOSPITAL_COMMUNITY): Payer: Self-pay | Admitting: Family Medicine

## 2018-02-08 ENCOUNTER — Encounter (HOSPITAL_COMMUNITY): Payer: Self-pay

## 2018-02-08 DIAGNOSIS — K029 Dental caries, unspecified: Secondary | ICD-10-CM

## 2018-02-08 DIAGNOSIS — K0889 Other specified disorders of teeth and supporting structures: Secondary | ICD-10-CM

## 2018-02-08 MED ORDER — HYDROCODONE-ACETAMINOPHEN 7.5-325 MG PO TABS: 1.0000 | ORAL_TABLET | Freq: Four times a day (QID) | ORAL | 0 refills | Status: DC | PRN

## 2018-02-08 MED ORDER — PENICILLIN V POTASSIUM 500 MG PO TABS: 500.0000 mg | ORAL_TABLET | Freq: Two times a day (BID) | ORAL | 0 refills | Status: AC

## 2018-02-08 MED ORDER — HYDROCODONE-ACETAMINOPHEN 5-325 MG PO TABS
ORAL_TABLET | ORAL | Status: AC
Start: 2018-02-08 — End: 2018-02-08
  Filled 2018-02-08: qty 2

## 2018-02-08 MED ORDER — HYDROCODONE-ACETAMINOPHEN 5-325 MG PO TABS
2.0000 | ORAL_TABLET | Freq: Once | ORAL | Status: AC
  Administered 2018-02-08: 2 via ORAL

## 2018-02-08 MED FILL — ?ATORVASTATIN 40MG TABLET: 40 | 30 days supply | Qty: 30 | Fill #8

## 2018-02-08 MED FILL — PENICILLIN VK 500 MG TABLET: 500 | 10 days supply | Qty: 20 | Fill #0

## 2018-02-08 NOTE — ED Triage Notes (Signed)
Pt presents with toothache on right side x 2 days.

## 2018-02-08 NOTE — Discharge Instructions (Signed)
Take the antibiotic Take pain medicine as needed Do not drive on the pain medicine Call dentist today to set up an appointment

## 2018-02-08 NOTE — Telephone Encounter (Signed)
Patient was here to be seen today for dental pain.  A prescription for hydrocodone was sent to his pharmacy.  They do not have it available.  He came by the office requesting a written prescription and this was provided for him.  His pharmacy was called to cancel the old prescription.

## 2018-02-08 NOTE — ED Provider Notes (Signed)
[facility]    CSN: [id number] Arrival date & time: 02/08/18  2956     History   Chief Complaint Chief Complaint  Patient presents with  . Dental Pain    HPI Jerry Jensen is a 47 y.o. male.   HPI Patient has teeth in poor repair.  He has multiple caries.  He has a lower right molar that is particularly fractured and painful.  It started hurting last night.  He was up all night.  He is holding the side of his face.  He states it feels swollen.  No fever or chills.  He does not have a dentist.  He did not have any recent trauma or illness. Past Medical History:  Diagnosis Date  . Anemia 2000  . Elevated cholesterol 12/01/2013   9/15: 10 yr CVD risk 7.5% 2/16: 10 yr CVD risk 4.0%     Patient Active Problem List   Diagnosis Date Noted  . Mixed hyperlipidemia 02/03/2017  . Acute rhinitis 12/06/2014  . Erectile dysfunction 05/03/2014  . Tinea pedis 11/28/2013  . IDA (iron deficiency anemia) 11/28/2013  . Family history of diabetes mellitus (DM) 11/28/2013  . Impacted cerumen of left ear 11/28/2013  . Tobacco dependence 11/28/2013  . Poor dentition 11/28/2013  . 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 No pertinent surgical history.     Home Medications    Prior to Admission medications   Medication Sig Start Date End Date Taking? Authorizing Provider  atorvastatin (LIPITOR) 40 MG tablet Take 1 tablet (40 mg total) by mouth daily. 05/16/17   Claiborne Rigg, NP  Ferrous Sulfate 27 MG TABS Take 27 mg by mouth daily.     [provider]  HYDROcodone-acetaminophen (NORCO) 7.5-325 MG tablet Take 1 tablet by mouth every 6 (six) hours as needed for moderate pain. 02/08/18   Eustace Moore, MD  penicillin v potassium (VEETID) 500 MG tablet Take 1 tablet (500 mg total) by mouth 2 (two) times daily for 10 days. 02/08/18 02/18/18  Eustace Moore, MD    Family History Family History  Problem Relation Age of Onset  . Cancer Mother    breast   . Diabetes Sister   . Hypertension Sister   . Anemia Sister   . Heart disease Neg Hx     Social History Social History   Tobacco Use  . Smoking status: Current Every Day Smoker    Packs/day: 0.50    Years: 30.00    Pack years: 15.00    Types: Cigarettes  . Smokeless tobacco: Never Used  Substance Use Topics  . Alcohol use: No    Alcohol/week: 0.0 standard drinks  . Drug use: No     Allergies   Patient has no known allergies.   Review of Systems Review of Systems  Constitutional: Negative for chills and fever.  HENT: Positive for dental problem. Negative for ear pain and sore throat.   Eyes: Negative for pain and visual disturbance.  Respiratory: Negative for cough and shortness of breath.   Cardiovascular: Negative for chest pain and palpitations.  Gastrointestinal: Negative for abdominal pain and vomiting.  Genitourinary: Negative for dysuria and hematuria.  Musculoskeletal: Negative for arthralgias and back pain.  Skin: Negative for color change and rash.  Neurological: Negative for seizures and syncope.  All other systems reviewed and are negative.    Physical Exam Triage Vital Signs ED Triage Vitals  Enc Vitals Group     BP 02/08/18 1101 (!) 161/91  Pulse Rate 02/08/18 1101 66     Resp 02/08/18 1101 20     Temp 02/08/18 1101 98.4 F (36.9 C)     Temp src --      SpO2 02/08/18 1101 97 %     Weight --      Height --      Head Circumference --      Peak Flow --      Pain Score 02/08/18 1100 10     Pain Loc --      Pain Edu? --      Excl. in GC? --    No data found.  Updated Vital Signs BP (!) 161/91   Pulse 66   Temp 98.4 F (36.9 C)   Resp 20   SpO2 97%      Physical Exam  Constitutional: He appears well-developed and well-nourished. No distress.  Acutely uncomfortable  HENT:  Head: Normocephalic and atraumatic.  Mouth/Throat: Oropharynx is clear and moist.    Eyes: Pupils are equal, round, and reactive to light.  Conjunctivae are normal.  Neck: Normal range of motion.  Cardiovascular: Normal rate.  Pulmonary/Chest: Effort normal. No respiratory distress.  Abdominal: Soft. He exhibits no distension.  Musculoskeletal: Normal range of motion. He exhibits no edema.  Lymphadenopathy:    He has no cervical adenopathy.  Neurological: He is alert.  Skin: Skin is warm and dry.  Psychiatric: He has a normal mood and affect. His behavior is normal.  Vitals reviewed.    UC Treatments / Results  Labs (all labs ordered are listed, but only abnormal results are displayed) Labs Reviewed - No data to display  EKG None  Radiology No results found.  Procedures Procedures (including critical care time)  Medications Ordered in UC Medications  HYDROcodone-acetaminophen (NORCO/VICODIN) 5-325 MG per tablet 2 tablet (2 tablets Oral Given 02/08/18 1123)    Initial Impression / Assessment and Plan / UC Course  I have reviewed the triage vital signs and the nursing notes.  Pertinent labs & imaging results that were available during my care of the patient were reviewed by me and considered in my medical decision making (see chart for details).    Explained that I can only provide temporary relief from his dental pain.  He needs to see a dentist for definitive care.  He is given the dental resource guide and told to call them today. He states he is taking ibuprofen without relief.  I am going to give him a limited number of hydrocodone.  He is cautioned not to take this and drive.  Cautioned this medicine can be addicting.  Cautioned he will not be refilled. Final Clinical Impressions(s) / UC Diagnoses   Final diagnoses:  Dental caries  Pain, dental     Discharge Instructions     Take the antibiotic Take pain medicine as needed Do not drive on the pain medicine Call dentist today to set up an appointment   ED Prescriptions    Medication Sig Dispense Auth. Provider   HYDROcodone-acetaminophen  (NORCO) 7.5-325 MG tablet Take 1 tablet by mouth every 6 (six) hours as needed for moderate pain. 15 tablet Eustace Moore, MD   penicillin v potassium (VEETID) 500 MG tablet Take 1 tablet (500 mg total) by mouth 2 (two) times daily for 10 days. 20 tablet Eustace Moore, MD     Controlled Substance Prescriptions [place] Controlled Substance Registry consulted? Yes, I have consulted the  Controlled Substances Registry for this patient,  and feel the risk/benefit ratio today is favorable for proceeding with this prescription for a controlled substance.   Eustace MooreNelson, Dakiyah Heinke Sue, MD 02/08/18 [phone]

## 2018-03-11 MED FILL — ?ATORVASTATIN 40MG TABLET: 40 | 30 days supply | Qty: 30 | Fill #9

## 2018-04-11 ENCOUNTER — Telehealth: Payer: Self-pay | Admitting: Nurse Practitioner

## 2018-04-11 NOTE — Telephone Encounter (Signed)
Patient would like to get a referral for dental and opthalmology.  Please follow up.

## 2018-04-11 NOTE — Telephone Encounter (Signed)
Left message on voicemail.   Pt will need to schedule an appointment for OV.  Please schedule an appointment for patient with PCP-

## 2018-04-12 MED FILL — ?ATORVASTATIN 40MG TABLET: 40 | 30 days supply | Qty: 30 | Fill #10

## 2018-04-12 NOTE — Telephone Encounter (Signed)
Tried to contact patient. No answer LVM to call back.

## 2018-04-14 ENCOUNTER — Ambulatory Visit: Payer: Self-pay | Attending: Family Medicine | Admitting: Physician Assistant

## 2018-04-14 VITALS — BP 135/87 | HR 85 | Temp 98.7°F | Ht 71.0 in | Wt 168.0 lb

## 2018-04-14 DIAGNOSIS — Z79899 Other long term (current) drug therapy: Secondary | ICD-10-CM

## 2018-04-14 DIAGNOSIS — D509 Iron deficiency anemia, unspecified: Secondary | ICD-10-CM

## 2018-04-14 DIAGNOSIS — E7800 Pure hypercholesterolemia, unspecified: Secondary | ICD-10-CM

## 2018-04-14 DIAGNOSIS — K089 Disorder of teeth and supporting structures, unspecified: Secondary | ICD-10-CM

## 2018-04-14 DIAGNOSIS — E782 Mixed hyperlipidemia: Secondary | ICD-10-CM

## 2018-04-14 MED ORDER — ATORVASTATIN CALCIUM 40 MG PO TABS: 40.0000 mg | ORAL_TABLET | Freq: Every day | ORAL | 6 refills | Status: DC

## 2018-04-14 NOTE — Progress Notes (Signed)
Patient needs referral to dentist.

## 2018-04-14 NOTE — Progress Notes (Signed)
Patient ID: Leam Saye, male   DOB: 08-20-1970, 48 y.o.   MRN: [id number]   Jerry Jensen, is a 48 y.o. male  WCB:[id number]  OHY:[id number]  DOB - Nov 03, 1970  Subjective:  Chief Complaint and HPI: Jerry Jensen is a 48 y.o. male here today for dental referral.  Also needs  meds RF and due  For blood work.  Compliant with cholesterol meds.  He has eaten this morning.    ROS:   Constitutional:  No f/c, No night sweats, No unexplained weight loss. EENT:  No vision changes, No blurry vision, No hearing changes. No other mouth, throat, or ear problems.  Respiratory: No cough, No SOB Cardiac: No CP, no palpitations GI:  No abd pain, No N/V/D. GU: No Urinary s/sx Musculoskeletal: No joint pain Neuro: No headache, no dizziness, no motor weakness.  Skin: No rash Endocrine:  No polydipsia. No polyuria.  Psych: Denies SI/HI  No problems updated.  ALLERGIES: No Known Allergies  PAST MEDICAL HISTORY: Past Medical History:  Diagnosis Date  . Anemia 2000  . Elevated cholesterol 12/01/2013   9/15: 10 yr CVD risk 7.5% 2/16: 10 yr CVD risk 4.0%     MEDICATIONS AT HOME: Prior to Admission medications   Medication Sig Start Date End Date Taking? Authorizing Provider  atorvastatin (LIPITOR) 40 MG tablet Take 1 tablet (40 mg total) by mouth daily. 05/16/17  Yes Claiborne Rigg, NP  Ferrous Sulfate 27 MG TABS Take 27 mg by mouth daily.    Yes [provider]     Objective:  EXAM:   Vitals:   04/14/18 0837  BP: 135/87  Pulse: 85  Temp: 98.7 F (37.1 C)  TempSrc: Oral  SpO2: 100%  Weight: 168 lb (76.2 kg)  Height: 5\' 11"  (1.803 m)    General appearance : A&OX3. NAD. Non-toxic-appearing HEENT: Atraumatic and Normocephalic.  PERRLA. EOM intact.  Mouth-generally poor dentition Neck: supple, no JVD. No cervical lymphadenopathy. No thyromegaly Chest/Lungs:  Breathing-non-labored, Good air entry bilaterally, breath sounds normal without rales, rhonchi, or wheezing    CVS: S1 S2 regular, no murmurs, gallops, rubs  Neurology:  CN II-XII grossly intact, Non focal.   Psych:  TP linear. J/I WNL. Normal speech. Appropriate eye contact and affect.  Skin:  No Rash  Data Review No results found for: HGBA1C   Assessment & Plan   1. Iron deficiency anemia, unspecified iron deficiency anemia type - CBC with Differential/Platelet  2. Mixed hyperlipidemia - Lipid Panel - Comprehensive metabolic panel  3. Poor dentition - Ambulatory referral to Dentistry  4. High risk medication use - Comprehensive metabolic panel  Patient have been counseled extensively about nutrition and exercise  Return in about 6 months (around 10/13/2018) for Zelda-cholesterol/med check.  The patient was given clear instructions to go to ER or return to medical center if symptoms don't improve, worsen or new problems develop. The patient verbalized understanding. The patient was told to call to get lab results if they haven't heard anything in the next week.     Georgian Co, PA-C [facility] [place], [place] [phone]   04/14/2018, 8:49 AM

## 2018-04-15 LAB — LIPID PANEL
Chol/HDL Ratio: 3.3 ratio (ref 0.0–5.0)
Cholesterol, Total: 159 mg/dL (ref 100–199)
HDL: 48 mg/dL (ref >39)
LDL Calculated: 80 mg/dL (ref 0–99)
TRIGLYCERIDES: 153 mg/dL — AB (ref 0–149)
VLDL Cholesterol Cal: 31 mg/dL (ref 5–40)

## 2018-04-15 LAB — CBC WITH DIFFERENTIAL/PLATELET
Basophils Absolute: 0.1 10*3/uL (ref 0.0–0.2)
Basos: 1 %
EOS (ABSOLUTE): 0.4 10*3/uL (ref 0.0–0.4)
Eos: 5 %
Hematocrit: 41.8 % (ref 37.5–51.0)
Hemoglobin: 14.2 g/dL (ref 13.0–17.7)
Immature Grans (Abs): 0 10*3/uL (ref 0.0–0.1)
Immature Granulocytes: 0 %
LYMPHS ABS: 4.1 10*3/uL — AB (ref 0.7–3.1)
Lymphs: 54 %
MCH: 29.3 pg (ref 26.6–33.0)
MCHC: 34 g/dL (ref 31.5–35.7)
MCV: 86 fL (ref 79–97)
MONOS ABS: 0.4 10*3/uL (ref 0.1–0.9)
Monocytes: 5 %
Neutrophils Absolute: 2.7 10*3/uL (ref 1.4–7.0)
Neutrophils: 35 %
PLATELETS: 239 10*3/uL (ref 150–450)
RBC: 4.85 x10E6/uL (ref 4.14–5.80)
RDW: 13.3 % (ref 11.6–15.4)
WBC: 7.6 10*3/uL (ref 3.4–10.8)

## 2018-04-15 LAB — COMPREHENSIVE METABOLIC PANEL
A/G RATIO: 2 (ref 1.2–2.2)
ALBUMIN: 4.7 g/dL (ref 4.0–5.0)
ALT: 28 IU/L (ref 0–44)
AST: 18 IU/L (ref 0–40)
Alkaline Phosphatase: 79 IU/L (ref 39–117)
BILIRUBIN TOTAL: 0.2 mg/dL (ref 0.0–1.2)
BUN / CREAT RATIO: 6 — AB (ref 9–20)
BUN: 6 mg/dL (ref 6–24)
CO2: 23 mmol/L (ref 20–29)
Calcium: 9.6 mg/dL (ref 8.7–10.2)
Chloride: 102 mmol/L (ref 96–106)
Creatinine, Ser: 0.93 mg/dL (ref 0.76–1.27)
GFR calc Af Amer: 111 mL/min/{1.73_m2} (ref >59)
GFR calc non Af Amer: 96 mL/min/{1.73_m2} (ref >59)
GLOBULIN, TOTAL: 2.4 g/dL (ref 1.5–4.5)
Glucose: 141 mg/dL — ABNORMAL HIGH (ref 65–99)
POTASSIUM: 4 mmol/L (ref 3.5–5.2)
SODIUM: 141 mmol/L (ref 134–144)
TOTAL PROTEIN: 7.1 g/dL (ref 6.0–8.5)

## 2018-04-18 ENCOUNTER — Telehealth: Payer: Self-pay

## 2018-04-18 NOTE — Telephone Encounter (Signed)
Contacted pt to go over lab results pt answered then hung up

## 2018-05-30 ENCOUNTER — Other Ambulatory Visit: Payer: Self-pay | Admitting: Nurse Practitioner

## 2018-05-30 DIAGNOSIS — E7800 Pure hypercholesterolemia, unspecified: Secondary | ICD-10-CM

## 2018-07-21 ENCOUNTER — Other Ambulatory Visit: Payer: Self-pay | Admitting: Nurse Practitioner

## 2018-07-21 DIAGNOSIS — E7800 Pure hypercholesterolemia, unspecified: Secondary | ICD-10-CM

## 2018-08-22 MED FILL — ?ATORVASTATIN 40MG TABLET: 40 | 90 days supply | Qty: 90 | Fill #0

## 2018-11-25 MED FILL — ?ATORVASTATIN 40MG TABLET: 40 | 90 days supply | Qty: 90 | Fill #0

## 2019-02-27 MED FILL — ATORVASTATIN CALCIUM 40 MG: 40 | 30 days supply | Qty: 30 | Fill #0

## 2019-03-14 ENCOUNTER — Telehealth: Payer: Self-pay

## 2019-03-14 NOTE — Telephone Encounter (Signed)
Called patient to do their pre-visit COVID screening.  Call went to voicemail. Unable to do prescreening.  

## 2019-03-15 ENCOUNTER — Ambulatory Visit (INDEPENDENT_AMBULATORY_CARE_PROVIDER_SITE_OTHER): Payer: Self-pay | Admitting: Physician Assistant

## 2019-03-15 ENCOUNTER — Other Ambulatory Visit: Payer: Self-pay

## 2019-03-15 VITALS — BP 151/82 | HR 81 | Temp 97.3°F | Resp 17 | Wt 178.4 lb

## 2019-03-15 DIAGNOSIS — B354 Tinea corporis: Secondary | ICD-10-CM

## 2019-03-15 DIAGNOSIS — R739 Hyperglycemia, unspecified: Secondary | ICD-10-CM

## 2019-03-15 DIAGNOSIS — N4889 Other specified disorders of penis: Secondary | ICD-10-CM

## 2019-03-15 MED ORDER — FLUCONAZOLE 150 MG PO TABS: 150.0000 mg | ORAL_TABLET | Freq: Once | ORAL | 0 refills | Status: AC

## 2019-03-15 MED ORDER — CLOTRIMAZOLE-BETAMETHASONE 1-0.05 % EX CREA: 1.0000 "application " | TOPICAL_CREAM | Freq: Two times a day (BID) | CUTANEOUS | 0 refills | Status: AC

## 2019-03-15 NOTE — Progress Notes (Signed)
Jerry Jensen, is a 48 y.o. male  SNK:[id number]  PFX:[id number]  DOB - 10/11/1970  Subjective:  Chief Complaint and HPI: Jerry Jensen is a 48 y.o. male here today to with rash on penis for at least 1 week.  Not itching or painful.  Monogamous with wife.  No penile discharge.  Started out small and now bigger.  Tried OTC cream but it didn't work.    ROS:   Constitutional:  No f/c, No night sweats, No unexplained weight loss. EENT:  No vision changes, No blurry vision, No hearing changes. No mouth, throat, or ear problems.  Respiratory: No cough, No SOB Cardiac: No CP, no palpitations GI:  No abd pain, No N/V/D. GU: No Urinary s/sx Musculoskeletal: No joint pain Neuro: No headache, no dizziness, no motor weakness.  Skin: + rash Endocrine:  No polydipsia. No polyuria.  Psych: Denies SI/HI  No problems updated.  ALLERGIES: No Known Allergies  PAST MEDICAL HISTORY: Past Medical History:  Diagnosis Date  . Anemia 2000  . Elevated cholesterol 12/01/2013   9/15: 10 yr CVD risk 7.5% 2/16: 10 yr CVD risk 4.0%     MEDICATIONS AT HOME: Prior to Admission medications   Medication Sig Start Date End Date Taking? Authorizing Provider  atorvastatin (LIPITOR) 40 MG tablet TAKE 1 TABLET BY MOUTH DAILY. 07/21/18  Yes Claiborne Rigg, NP  Ferrous Sulfate 27 MG TABS Take 27 mg by mouth daily.    Yes [provider]  clotrimazole-betamethasone (LOTRISONE) cream Apply 1 application topically 2 (two) times daily. 03/15/19   Anders Simmonds, PA-C  fluconazole (DIFLUCAN) 150 MG tablet Take 1 tablet (150 mg total) by mouth once for 1 dose. Repeat in 1 week 03/15/19 03/15/19  Anders Simmonds, PA-C     Objective:  EXAM:   Vitals:   03/15/19 1338  BP: (!) 151/82  Pulse: 81  Resp: 17  Temp: (!) 97.3 F (36.3 C)  TempSrc: Temporal  SpO2: 96%  Weight: 178 lb 6.4 oz (80.9 kg)    General appearance : A&OX3. NAD. Non-toxic-appearing HEENT: Atraumatic and Normocephalic.   PERRLA. EOM intact.   Chest/Lungs:  Breathing-non-labored, Good air entry bilaterally, breath sounds normal without rales, rhonchi, or wheezing  CVS: S1 S2 regular, no murmurs, gallops, rubs  Genitalia:  R lateral aspect of penis with well-demarcated ringworm type lesion ~1.5 cm with smaller 39mm lesions just proximal Neurology:  CN II-XII grossly intact, Non focal.   Psych:  TP linear. J/I WNL. Normal speech. Appropriate eye contact and affect.  Skin:  No Rash  Data Review No results found for: HGBA1C   Assessment & Plan   1. Tinea corporis - clotrimazole-betamethasone (LOTRISONE) cream; Apply 1 application topically 2 (two) times daily.  Dispense: 30 g; Refill: 0 - fluconazole (DIFLUCAN) 150 MG tablet; Take 1 tablet (150 mg total) by mouth once for 1 dose. Repeat in 1 week  Dispense: 2 tablet; Refill: 0  2. High blood sugar Noticed blood sugar a little high when last checked. I have had a lengthy discussion and provided education about insulin resistance and the intake of too much sugar/refined carbohydrates.  I have advised the patient to work at a goal of eliminating sugary drinks, candy, desserts, sweets, refined sugars, processed foods, and white carbohydrates.  The patient expresses understanding.  - Hemoglobin A1c   Patient have been counseled extensively about nutrition and exercise  Return in about 3 months (around 06/13/2019) for PCP appt.  The patient was given clear  instructions to go to ER or return to medical center if symptoms don't improve, worsen or new problems develop. The patient verbalized understanding. The patient was told to call to get lab results if they haven't heard anything in the next week.     [name], PA-C [facility] and [place], [place]   03/15/2019, 1:53 PMPatient ID: Jerry Jensen, male   DOB: [birth year], 48 y.o.   MRN: [id number]

## 2019-03-15 NOTE — Patient Instructions (Signed)

## 2019-03-16 LAB — HEMOGLOBIN A1C
Est. average glucose Bld gHb Est-mCnc: 126 mg/dL
Hgb A1c MFr Bld: 6 % — ABNORMAL HIGH (ref 4.8–5.6)

## 2019-03-21 MED FILL — ATORVASTATIN CALCIUM 40 MG: 40 | 30 days supply | Qty: 30 | Fill #0

## 2019-03-24 ENCOUNTER — Encounter: Payer: Self-pay | Admitting: *Deleted

## 2019-03-27 ENCOUNTER — Telehealth: Payer: Self-pay | Admitting: *Deleted

## 2019-03-27 NOTE — Telephone Encounter (Signed)
-----   Message from Anders Simmonds, [place] sent at 03/16/2019  8:36 AM EST ----- Please call patient.  He needs to eliminate sugar and white carbohydrates bc he has developed early diabetes.  Drink more water too.  Make appt for 2-3 months follow-up to recheck on blood sugars.  Thanks, Georgian Co, PA-C

## 2019-03-27 NOTE — Telephone Encounter (Signed)
Medical Assistant left message on patient's home and cell voicemail. Voicemail states to give a call back to Shanekia Latella with [facility] at [phone].  

## 2019-05-23 ENCOUNTER — Other Ambulatory Visit: Payer: Self-pay | Admitting: Physician Assistant

## 2019-05-23 DIAGNOSIS — E7800 Pure hypercholesterolemia, unspecified: Secondary | ICD-10-CM

## 2019-06-01 ENCOUNTER — Other Ambulatory Visit: Payer: Self-pay | Admitting: Nurse Practitioner

## 2019-06-01 MED FILL — ATORVASTATIN CALCIUM 40 MG: 40 | 30 days supply | Qty: 30 | Fill #0

## 2019-06-14 ENCOUNTER — Ambulatory Visit: Payer: Medicaid Other | Admitting: Internal Medicine

## 2019-06-14 ENCOUNTER — Ambulatory Visit: Payer: Medicaid Other | Attending: Internal Medicine | Admitting: Nurse Practitioner

## 2019-06-14 ENCOUNTER — Other Ambulatory Visit: Payer: Self-pay

## 2019-07-06 MED FILL — ATORVASTATIN CALCIUM 40 MG: 40 | 30 days supply | Qty: 30 | Fill #1

## 2019-10-10 MED FILL — ATORVASTATIN CALCIUM 40 MG: 40 | 90 days supply | Qty: 90 | Fill #2

## 2020-03-29 MED FILL — ATORVASTATIN CALCIUM 40 MG: 40 | 60 days supply | Qty: 60 | Fill #3

## 2020-05-31 ENCOUNTER — Other Ambulatory Visit: Payer: Self-pay | Admitting: Nurse Practitioner

## 2020-05-31 DIAGNOSIS — E7800 Pure hypercholesterolemia, unspecified: Secondary | ICD-10-CM

## 2020-05-31 NOTE — Telephone Encounter (Signed)
   Notes to clinic: LOV-03/15/2019 Overdue for follow up Patient has not schedule follow up  Last filled 03/29/2020   Requested Prescriptions  Pending Prescriptions Disp Refills   atorvastatin (LIPITOR) 40 MG tablet [Pharmacy Med Name: ATORVASTATIN CALCIUM 40 MG 40 Tablet] 60 tablet 6    Sig: TAKE 1 TABLET (40 MG TOTAL) BY MOUTH DAILY.      Cardiovascular:  Antilipid - Statins Failed - 05/31/2020  9:08 AM      Failed - Total Cholesterol in normal range and within 360 days    Cholesterol, Total  Date Value Ref Range Status  04/14/2018 159 100 - 199 mg/dL Final          Failed - LDL in normal range and within 360 days    LDL Calculated  Date Value Ref Range Status  04/14/2018 80 0 - 99 mg/dL Final          Failed - HDL in normal range and within 360 days    HDL  Date Value Ref Range Status  04/14/2018 48 >39 mg/dL Final          Failed - Triglycerides in normal range and within 360 days    Triglycerides  Date Value Ref Range Status  04/14/2018 153 (H) 0 - 149 mg/dL Final          Failed - Valid encounter within last 12 months    Recent Outpatient Visits           2 years ago Iron deficiency anemia, unspecified iron deficiency anemia type   [facility] [place], [place], [place]   3 years ago PE (physical exam), annual   [facility] [address] [facility] [place], [place] W, NP   3 years ago Iron deficiency anemia, unspecified iron deficiency anemia type   [facility] [place], [place] W, NP   4 years ago Iron deficiency anemia, unspecified iron deficiency anemia type   [facility] Dessa Phi, MD   5 years ago Acute rhinitis   [facility] Dessa Phi, MD                Passed - Patient is not pregnant

## 2020-09-30 ENCOUNTER — Ambulatory Visit: Payer: Medicaid Other | Admitting: Nurse Practitioner
